# Patient Record
Sex: Male | Born: 1950
Health system: Southern US, Community
[De-identification: ages and names within clinical notes are randomized; demographics above are authoritative.]

## PROBLEM LIST (undated history)

## (undated) DIAGNOSIS — J40 Bronchitis, not specified as acute or chronic: Secondary | ICD-10-CM

## (undated) DIAGNOSIS — C61 Malignant neoplasm of prostate: Secondary | ICD-10-CM

## (undated) DIAGNOSIS — J329 Chronic sinusitis, unspecified: Secondary | ICD-10-CM

## (undated) HISTORY — DX: Chronic sinusitis, unspecified: J32.9

## (undated) HISTORY — DX: Malignant neoplasm of prostate: C61

## (undated) HISTORY — DX: Bronchitis, not specified as acute or chronic: J40

---

## 2004-07-02 ENCOUNTER — Ambulatory Visit: Payer: Self-pay | Admitting: Anesthesiology

## 2004-08-03 ENCOUNTER — Ambulatory Visit: Payer: Self-pay | Admitting: Anesthesiology

## 2004-12-17 ENCOUNTER — Ambulatory Visit: Payer: Self-pay | Admitting: Anesthesiology

## 2005-01-28 ENCOUNTER — Ambulatory Visit: Payer: Self-pay | Admitting: Anesthesiology

## 2005-02-22 ENCOUNTER — Ambulatory Visit: Payer: Self-pay | Admitting: Anesthesiology

## 2005-03-24 ENCOUNTER — Ambulatory Visit: Payer: Self-pay | Admitting: Anesthesiology

## 2005-04-22 ENCOUNTER — Ambulatory Visit: Payer: Self-pay | Admitting: Anesthesiology

## 2005-10-28 ENCOUNTER — Ambulatory Visit: Payer: Self-pay | Admitting: Anesthesiology

## 2005-12-01 ENCOUNTER — Ambulatory Visit: Payer: Self-pay | Admitting: Anesthesiology

## 2006-01-05 ENCOUNTER — Ambulatory Visit: Payer: Self-pay | Admitting: Anesthesiology

## 2006-02-03 ENCOUNTER — Ambulatory Visit: Payer: Self-pay | Admitting: Anesthesiology

## 2006-06-30 ENCOUNTER — Ambulatory Visit: Payer: Self-pay | Admitting: Anesthesiology

## 2006-08-04 ENCOUNTER — Ambulatory Visit: Payer: Self-pay | Admitting: Pain Medicine

## 2006-09-22 ENCOUNTER — Ambulatory Visit: Payer: Self-pay | Admitting: Anesthesiology

## 2006-10-26 ENCOUNTER — Ambulatory Visit: Payer: Self-pay | Admitting: Anesthesiology

## 2007-03-29 ENCOUNTER — Ambulatory Visit: Payer: Self-pay | Admitting: Anesthesiology

## 2007-05-24 ENCOUNTER — Ambulatory Visit: Payer: Self-pay | Admitting: Anesthesiology

## 2007-06-23 ENCOUNTER — Ambulatory Visit: Payer: Self-pay | Admitting: Anesthesiology

## 2007-07-19 ENCOUNTER — Ambulatory Visit: Payer: Self-pay | Admitting: Anesthesiology

## 2008-01-25 ENCOUNTER — Ambulatory Visit: Payer: Self-pay | Admitting: Anesthesiology

## 2008-02-15 ENCOUNTER — Ambulatory Visit: Payer: Self-pay | Admitting: Anesthesiology

## 2008-07-25 ENCOUNTER — Ambulatory Visit: Payer: Self-pay | Admitting: Anesthesiology

## 2008-08-26 ENCOUNTER — Ambulatory Visit: Payer: Self-pay | Admitting: Anesthesiology

## 2009-06-13 ENCOUNTER — Encounter
Admission: RE | Admit: 2009-06-13 | Discharge: 2009-09-02 | Payer: Self-pay | Admitting: Physical Medicine & Rehabilitation

## 2009-06-17 ENCOUNTER — Ambulatory Visit: Payer: Self-pay | Admitting: Physical Medicine & Rehabilitation

## 2009-06-28 ENCOUNTER — Ambulatory Visit (HOSPITAL_COMMUNITY)
Admission: RE | Admit: 2009-06-28 | Discharge: 2009-06-28 | Payer: Self-pay | Admitting: Physical Medicine & Rehabilitation

## 2009-07-07 ENCOUNTER — Ambulatory Visit: Payer: Self-pay | Admitting: Physical Medicine & Rehabilitation

## 2009-08-04 ENCOUNTER — Ambulatory Visit: Payer: Self-pay | Admitting: Physical Medicine & Rehabilitation

## 2009-09-02 ENCOUNTER — Ambulatory Visit: Payer: Self-pay | Admitting: Physical Medicine & Rehabilitation

## 2009-09-06 HISTORY — PX: HIP SURGERY: SHX245

## 2009-09-26 ENCOUNTER — Encounter
Admission: RE | Admit: 2009-09-26 | Discharge: 2009-12-25 | Payer: Self-pay | Admitting: Physical Medicine & Rehabilitation

## 2009-09-29 ENCOUNTER — Ambulatory Visit: Payer: Self-pay | Admitting: Physical Medicine & Rehabilitation

## 2009-10-27 ENCOUNTER — Ambulatory Visit: Payer: Self-pay | Admitting: Physical Medicine & Rehabilitation

## 2009-11-27 ENCOUNTER — Ambulatory Visit: Payer: Self-pay | Admitting: Physical Medicine & Rehabilitation

## 2010-01-01 ENCOUNTER — Encounter
Admission: RE | Admit: 2010-01-01 | Discharge: 2010-01-05 | Payer: Self-pay | Admitting: Physical Medicine & Rehabilitation

## 2010-01-05 ENCOUNTER — Ambulatory Visit: Payer: Self-pay | Admitting: Physical Medicine & Rehabilitation

## 2012-09-06 DIAGNOSIS — C61 Malignant neoplasm of prostate: Secondary | ICD-10-CM

## 2012-09-06 HISTORY — DX: Malignant neoplasm of prostate: C61

## 2015-02-20 ENCOUNTER — Other Ambulatory Visit: Payer: Self-pay | Admitting: Pain Medicine

## 2015-02-20 ENCOUNTER — Ambulatory Visit: Payer: BC Managed Care – PPO | Attending: Pain Medicine | Admitting: Pain Medicine

## 2015-02-20 ENCOUNTER — Encounter: Payer: Self-pay | Admitting: Pain Medicine

## 2015-02-20 VITALS — BP 134/76 | HR 104 | Temp 98.1°F | Resp 16 | Ht 72.0 in | Wt 275.0 lb

## 2015-02-20 DIAGNOSIS — M5030 Other cervical disc degeneration, unspecified cervical region: Secondary | ICD-10-CM

## 2015-02-20 DIAGNOSIS — M542 Cervicalgia: Secondary | ICD-10-CM | POA: Diagnosis present

## 2015-02-20 DIAGNOSIS — C61 Malignant neoplasm of prostate: Secondary | ICD-10-CM | POA: Insufficient documentation

## 2015-02-20 DIAGNOSIS — M4806 Spinal stenosis, lumbar region: Secondary | ICD-10-CM | POA: Diagnosis not present

## 2015-02-20 DIAGNOSIS — M19011 Primary osteoarthritis, right shoulder: Secondary | ICD-10-CM | POA: Diagnosis not present

## 2015-02-20 DIAGNOSIS — M5416 Radiculopathy, lumbar region: Secondary | ICD-10-CM

## 2015-02-20 DIAGNOSIS — M48062 Spinal stenosis, lumbar region with neurogenic claudication: Secondary | ICD-10-CM | POA: Insufficient documentation

## 2015-02-20 DIAGNOSIS — M5136 Other intervertebral disc degeneration, lumbar region: Secondary | ICD-10-CM | POA: Insufficient documentation

## 2015-02-20 DIAGNOSIS — M533 Sacrococcygeal disorders, not elsewhere classified: Secondary | ICD-10-CM | POA: Diagnosis not present

## 2015-02-20 DIAGNOSIS — M47896 Other spondylosis, lumbar region: Secondary | ICD-10-CM | POA: Insufficient documentation

## 2015-02-20 DIAGNOSIS — F410 Panic disorder [episodic paroxysmal anxiety] without agoraphobia: Secondary | ICD-10-CM | POA: Insufficient documentation

## 2015-02-20 DIAGNOSIS — M47812 Spondylosis without myelopathy or radiculopathy, cervical region: Secondary | ICD-10-CM | POA: Insufficient documentation

## 2015-02-20 DIAGNOSIS — M545 Low back pain: Secondary | ICD-10-CM | POA: Diagnosis present

## 2015-02-20 DIAGNOSIS — M792 Neuralgia and neuritis, unspecified: Secondary | ICD-10-CM | POA: Diagnosis not present

## 2015-02-20 DIAGNOSIS — M47816 Spondylosis without myelopathy or radiculopathy, lumbar region: Secondary | ICD-10-CM | POA: Insufficient documentation

## 2015-02-20 DIAGNOSIS — M48061 Spinal stenosis, lumbar region without neurogenic claudication: Secondary | ICD-10-CM | POA: Insufficient documentation

## 2015-02-20 DIAGNOSIS — M461 Sacroiliitis, not elsewhere classified: Secondary | ICD-10-CM | POA: Diagnosis not present

## 2015-02-20 DIAGNOSIS — M169 Osteoarthritis of hip, unspecified: Secondary | ICD-10-CM | POA: Diagnosis not present

## 2015-02-20 MED ORDER — CIPROFLOXACIN HCL 250 MG PO TABS: 250.0000 mg | ORAL_TABLET | Freq: Two times a day (BID) | ORAL | Status: DC

## 2015-02-20 NOTE — Patient Instructions (Addendum)
Continue present medications.  Lumbar facet, medial branch nerve, to be performed Wednesday, 03/12/2015  F/U PCP for evaliation of  BP and general medical  condition. See your primary care physician regarding your elevated pulse and general medical condition   F/U surgical evaluation.  F/U neurological evaluation.  May consider radiofrequency rhizolysis or intraspinal procedures pending response to present treatment and F/U evaluation.  Patient to call [facility] should patient have concerns prior to scheduled return appointment. GENERAL RISKS AND COMPLICATIONS  What are the risk, side effects and possible complications? Generally speaking, most procedures are safe.  However, with any procedure there are risks, side effects, and the possibility of complications.  The risks and complications are dependent upon the sites that are lesioned, or the type of nerve block to be performed.  The closer the procedure is to the spine, the more serious the risks are.  Great care is taken when placing the radio frequency needles, block needles or lesioning probes, but sometimes complications can occur. 1. Infection: Any time there is an injection through the skin, there is a risk of infection.  This is why sterile conditions are used for these blocks.  There are four possible types of infection. 1. Localized skin infection. 2. Central Nervous System Infection-This can be in the form of Meningitis, which can be deadly. 3. Epidural Infections-This can be in the form of an epidural abscess, which can cause pressure inside of the spine, causing compression of the spinal cord with subsequent paralysis. This would require an emergency surgery to decompress, and there are no guarantees that the patient would recover from the paralysis. 4. Discitis-This is an infection of the intervertebral discs.  It occurs in about 1% of discography procedures.  It is difficult to treat and it may lead to surgery.         2. Pain: the needles have to go through skin and soft tissues, will cause soreness.       3. Damage to internal structures:  The nerves to be lesioned may be near blood vessels or    other nerves which can be potentially damaged.       4. Bleeding: Bleeding is more common if the patient is taking blood thinners such as  aspirin, Coumadin, Ticiid, Plavix, etc., or if he/she have some genetic predisposition  such as hemophilia. Bleeding into the spinal canal can cause compression of the spinal  cord with subsequent paralysis.  This would require an emergency surgery to  decompress and there are no guarantees that the patient would recover from the  paralysis.       5. Pneumothorax:  Puncturing of a lung is a possibility, every time a needle is introduced in  the area of the chest or upper back.  Pneumothorax refers to free air around the  collapsed lung(s), inside of the thoracic cavity (chest cavity).  Another two possible  complications related to a similar event would include: Hemothorax and Chylothorax.   These are variations of the Pneumothorax, where instead of air around the collapsed  lung(s), you may have blood or chyle, respectively.       6. Spinal headaches: They may occur with any procedures in the area of the spine.       7. Persistent CSF (Cerebro-Spinal Fluid) leakage: This is a rare problem, but may occur  with prolonged intrathecal or epidural catheters either due to the formation of a fistulous  track or a dural tear.  8. Nerve damage: By working so close to the spinal cord, there is always a possibility of  nerve damage, which could be as serious as a permanent spinal cord injury with  paralysis.       9. Death:  Although rare, severe deadly allergic reactions known as "Anaphylactic  reaction" can occur to any of the medications used.      10. Worsening of the symptoms:  We can always make thing worse.  What are the chances of something like this happening? Chances of any of this  occuring are extremely low.  By statistics, you have more of a chance of getting killed in a motor vehicle accident: while driving to the hospital than any of the above occurring .  Nevertheless, you should be aware that they are possibilities.  In general, it is similar to taking a shower.  Everybody knows that you can slip, hit your head and get killed.  Does that mean that you should not shower again?  Nevertheless always keep in mind that statistics do not mean anything if you happen to be on the wrong side of them.  Even if a procedure has a 1 (one) in a 1,000,000 (million) chance of going wrong, it you happen to be that one..Also, keep in mind that by statistics, you have more of a chance of having something go wrong when taking medications.  Who should not have this procedure? If you are on a blood thinning medication (e.g. Coumadin, Plavix, see list of "Blood Thinners"), or if you have an active infection going on, you should not have the procedure.  If you are taking any blood thinners, please inform your physician.  How should I prepare for this procedure?  Do not eat or drink anything at least six hours prior to the procedure.  Bring a driver with you .  It cannot be a taxi.  Come accompanied by an adult that can drive you back, and that is strong enough to help you if your legs get weak or numb from the local anesthetic.  Take all of your medicines the morning of the procedure with just enough water to swallow them.  If you have diabetes, make sure that you are scheduled to have your procedure done first thing in the morning, whenever possible.  If you have diabetes, take only half of your insulin dose and notify our nurse that you have done so as soon as you arrive at the clinic.  If you are diabetic, but only take blood sugar pills (oral hypoglycemic), then do not take them on the morning of your procedure.  You may take them after you have had the procedure.  Do not take aspirin  or any aspirin-containing medications, at least eleven (11) days prior to the procedure.  They may prolong bleeding.  Wear loose fitting clothing that may be easy to take off and that you would not mind if it got stained with Betadine or blood.  Do not wear any jewelry or perfume  Remove any nail coloring.  It will interfere with some of our monitoring equipment.  NOTE: Remember that this is not meant to be interpreted as a complete list of all possible complications.  Unforeseen problems may occur.  BLOOD THINNERS The following drugs contain aspirin or other products, which can cause increased bleeding during surgery and should not be taken for 2 weeks prior to and 1 week after surgery.  If you should need take something for relief of minor  pain, you may take acetaminophen which is found in Tylenol,m Datril, Anacin-3 and Panadol. It is not blood thinner. The products listed below are.  Do not take any of the products listed below in addition to any listed on your instruction sheet.  A.P.C or A.P.C with Codeine Codeine Phosphate Capsules #3 Ibuprofen Ridaura  ABC compound Congesprin Imuran rimadil  Advil Cope Indocin Robaxisal  Alka-Seltzer Effervescent Pain Reliever and Antacid Coricidin or Coricidin-D  Indomethacin Rufen  Alka-Seltzer plus Cold Medicine Cosprin Ketoprofen S-A-C Tablets  Anacin Analgesic Tablets or Capsules Coumadin Korlgesic Salflex  Anacin Extra Strength Analgesic tablets or capsules CP-2 Tablets Lanoril Salicylate  Anaprox Cuprimine Capsules Levenox Salocol  Anexsia-D Dalteparin Magan Salsalate  Anodynos Darvon compound Magnesium Salicylate Sine-off  Ansaid Dasin Capsules Magsal Sodium Salicylate  Anturane Depen Capsules Marnal Soma  APF Arthritis pain formula Dewitt's Pills Measurin Stanback  Argesic Dia-Gesic Meclofenamic Sulfinpyrazone  Arthritis Bayer Timed Release Aspirin Diclofenac Meclomen Sulindac  Arthritis pain formula Anacin Dicumarol Medipren Supac   Analgesic (Safety coated) Arthralgen Diffunasal Mefanamic Suprofen  Arthritis Strength Bufferin Dihydrocodeine Mepro Compound Suprol  Arthropan liquid Dopirydamole Methcarbomol with Aspirin Synalgos  ASA tablets/Enseals Disalcid Micrainin Tagament  Ascriptin Doan's Midol Talwin  Ascriptin A/D Dolene Mobidin Tanderil  Ascriptin Extra Strength Dolobid Moblgesic Ticlid  Ascriptin with Codeine Doloprin or Doloprin with Codeine Momentum Tolectin  Asperbuf Duoprin Mono-gesic Trendar  Aspergum Duradyne Motrin or Motrin IB Triminicin  Aspirin plain, buffered or enteric coated Durasal Myochrisine Trigesic  Aspirin Suppositories Easprin Nalfon Trillsate  Aspirin with Codeine Ecotrin Regular or Extra Strength Naprosyn Uracel  Atromid-S Efficin Naproxen Ursinus  Auranofin Capsules Elmiron Neocylate Vanquish  Axotal Emagrin Norgesic Verin  Azathioprine Empirin or Empirin with Codeine Normiflo Vitamin E  Azolid Emprazil Nuprin Voltaren  Bayer Aspirin plain, buffered or children's or timed BC Tablets or powders Encaprin Orgaran Warfarin Sodium  Buff-a-Comp Enoxaparin Orudis Zorpin  Buff-a-Comp with Codeine Equegesic Os-Cal-Gesic   Buffaprin Excedrin plain, buffered or Extra Strength Oxalid   Bufferin Arthritis Strength Feldene Oxphenbutazone   Bufferin plain or Extra Strength Feldene Capsules Oxycodone with Aspirin   Bufferin with Codeine Fenoprofen Fenoprofen Pabalate or Pabalate-SF   Buffets II Flogesic Panagesic   Buffinol plain or Extra Strength Florinal or Florinal with Codeine Panwarfarin   Buf-Tabs Flurbiprofen Penicillamine   Butalbital Compound Four-way cold tablets Penicillin   Butazolidin Fragmin Pepto-Bismol   Carbenicillin Geminisyn Percodan   Carna Arthritis Reliever Geopen Persantine   Carprofen Gold's salt Persistin   Chloramphenicol Goody's Phenylbutazone   Chloromycetin Haltrain Piroxlcam   Clmetidine heparin Plaquenil   Cllnoril Hyco-pap Ponstel   Clofibrate Hydroxy  chloroquine Propoxyphen         Before stopping any of these medications, be sure to consult the physician who ordered them.  Some, such as Coumadin (Warfarin) are ordered to prevent or treat serious conditions such as "deep thrombosis", "pumonary embolisms", and other heart problems.  The amount of time that you may need off of the medication may also vary with the medication and the reason for which you were taking it.  If you are taking any of these medications, please make sure you notify your pain physician before you undergo any procedures.         Facet Joint Block The facet joints connect the bones of the spine (vertebrae). They make it possible for you to bend, twist, and make other movements with your spine. They also prevent you from overbending, overtwisting, and making other excessive movements.  A facet  joint block is a procedure where a numbing medicine (anesthetic) is injected into a facet joint. Often, a type of anti-inflammatory medicine called a steroid is also injected. A facet joint block may be done for two reasons:  2. Diagnosis. A facet joint block may be done as a test to see whether neck or back pain is caused by a worn-down or infected facet joint. If the pain gets better after a facet joint block, it means the pain is probably coming from the facet joint. If the pain does not get better, it means the pain is probably not coming from the facet joint.  3. Therapy. A facet joint block may be done to relieve neck or back pain caused by a facet joint. A facet joint block is only done as a therapy if the pain does not improve with medicine, exercise programs, physical therapy, and other forms of pain management. LET [facility] CARE PROVIDER KNOW ABOUT:   Any allergies you have.   All medicines you are taking, including vitamins, herbs, eyedrops, and over-the-counter medicines and creams.   Previous problems you or members of your family have had with the use of  anesthetics.   Any blood disorders you have had.   Other health problems you have. RISKS AND COMPLICATIONS Generally, having a facet joint block is safe. However, as with any procedure, complications can occur. Possible complications associated with having a facet joint block include:   Bleeding.   Injury to a nerve near the injection site.   Pain at the injection site.   Weakness or numbness in areas controlled by nerves near the injection site.   Infection.   Temporary fluid retention.   Allergic reaction to anesthetics or medicines used during the procedure. BEFORE THE PROCEDURE   Follow your health care provider's instructions if you are taking dietary supplements or medicines. You may need to stop taking them or reduce your dosage.   Do not take any new dietary supplements or medicines without asking your health care provider first.   Follow your health care provider's instructions about eating and drinking before the procedure. You may need to stop eating and drinking several hours before the procedure.   Arrange to have an adult drive you home after the procedure. PROCEDURE 12. You may need to remove your clothing and dress in an open-back gown so that your health care provider can access your spine.  13. The procedure will be done while you are lying on an X-ray table. Most of the time you will be asked to lie on your stomach, but you may be asked to lie in a different position if an injection will be made in your neck.  14. Special machines will be used to monitor your oxygen levels, heart rate, and blood pressure.  15. If an injection will be made in your neck, an intravenous (IV) tube will be inserted into one of your veins. Fluids and medicine will flow directly into your body through the IV tube.  16. The area over the facet joint where the injection will be made will be cleaned with an antiseptic soap. The surrounding skin will be covered with sterile  drapes.  17. An anesthetic will be applied to your skin to make the injection area numb. You may feel a temporary stinging or burning sensation.  18. A video X-ray machine will be used to locate the joint. A contrast dye may be injected into the facet joint area to help with locating  the joint.  19. When the joint is located, an anesthetic medicine will be injected into the joint through the needle.  60. Your health care provider will ask you whether you feel pain relief. If you do feel relief, a steroid may be injected to provide pain relief for a longer period of time. If you do not feel relief or feel only partial relief, additional injections of an anesthetic may be made in other facet joints.  21. The needle will be removed, the skin will be cleansed, and bandages will be applied.  AFTER THE PROCEDURE   You will be observed for 15-30 minutes before being allowed to go home. Do not drive. Have an adult drive you or take a taxi or public transportation instead.   If you feel pain relief, the pain will return in several hours or days when the anesthetic wears off.   You may feel pain relief 2-14 days after the procedure. The amount of time this relief lasts varies from person to person.   It is normal to feel some tenderness over the injected area(s) for 2 days following the procedure.   If you have diabetes, you may have a temporary increase in blood sugar. Document Released: 01/12/2007 Document Revised: 01/07/2014 Document Reviewed: 06/12/2012 [facility] Patient Information 2015 [place], [place]. This information is not intended to replace advice given to you by your health care provider. Make sure you discuss any questions you have with your health care provider.

## 2015-02-20 NOTE — Progress Notes (Signed)
Subjective:    Patient ID: Martin Golden, male    DOB: 1951-01-03, 64 y.o.   MRN: [id number]  HPI  Patient is 64 year old gentleman who comes to pain management Center at the request of Dr. Lisette Grinder the third for further evaluation and treatment of pain involving the lumbar lower extremity region as well as region of the neck. Patient is with prior interventional treatment performed prior to coming to [facility] Center with persistent pain despite prior treatment. Patient states that his pain involving the lower back and lower extremity region has been present for approximately 15 years. Patient states that pain involving the neck began approximately 1 year ago patient was treated at the [facility] in [facility]. Patient stated that the lower back lower extremity pain was most bothersome at this time describing the pain as aching exhausting tiring sensation awakening patient from sleep and associated with fatigue. Patient stated the pain increases with bending standing squatting stooping and twisting. Patient stated the pain had decreased to some degree with resting hot packs lying down physical therapy and medications. We discussed patient's condition on today's visit and reviewed patient's prior lumbar MRI. The results of the cervical MRI studies were unavailable on today's visit. Evaluation of patient and discussion of patient's condition and decision was made to proceed with lumbar facet, medial branch nerve, blocks at time return appointment in attempt to decrease severity of patient's symptoms. Minimize progression of patient's symptoms. And hopefully avoid the need for more involved treatment. The patient was understanding and agreement with suggested treatment plan.      Review of Systems  Cardiovascular Needs antibiotic prior to procedures due to prior total hip  replacement  Pulmonary Unremarkable  Neurological Unremarkable  Psychological Panic attacks  Gastrointestinal Unremarkable  Genitourinary Blood in urine Cancer of prostate  Hematological Unremarkable  Endocrine Unremarkable  Rheumatological Osteoarthritis  Musculoskeletal Unremarkable  Other significant Cancer prostate               Objective:   Physical Exam  There was tenderness to palpation of the splenius capitis of talus region of moderate degree. There was tenderness to palpation of the trapezius levator scapula and rhomboid musculature regions of moderate degree. Palpation over the thoracic facet thoracic paraspinal musculature region was with moderate tends to palpation. Palpation of the acromioclavicular glenohumeral joint region was with mild discomfort. Patient appeared to be with slightly decreased grip strength. Tinel and Phalen's maneuver were without increase of pain of significant degree. Palpation over the region of the thoracic facet thoracic paraspinal musculature region was with moderate tends to palpation of the mid and lower thoracic paraspinal musculature region. Palpation over the lumbar paraspinal musculature region lumbar facet region associated with moderate to moderately severe discomfort. Lateral bending and rotation reproduce moderate to moderate severe discomfort. Straight leg raising was tolerates approximately a definite increased pain with dorsiflexion noted. There was negative clonus negative Homans. DTRs difficult to elicit patient had difficulty relaxing. There was tenderness over the PSIS and PII S region a moderate degree. Abdomen nontender no costovertebral angle tenderness noted.    Assessment & Plan:  Degenerative disc disease lumbar spine Lumbar spondylosis with degenerative disc disease causing multilevel central foraminal and subarticular lateral recess stenosis  Lumbar facet syndrome  Lumbar radiculopathy  Lumbar  stenosis  Sacroiliac joint dysfunction  History of right renal cyst with suspected left renal atrophy  Carcinoma of prostate  Degenerative disc disease cervical spine    Plan  Continue present medications.  Lumbar facet, medial branch nerve, blocks to be performed at time return appointment  F/U PCP for evaliation of  BP and general medical  condition.  F/U surgical evaluation.  F/U neurological evaluation.  CT of cervical spine. We will obtain CT of cervical spine for update of the cervical spine. Patient is with prior total hip replacement and is unable to undergo  MRI.  May consider radiofrequency rhizolysis or intraspinal procedures pending response to present treatment and F/U evaluation.  Patient to call [facility] Management Center should patient have concerns prior to scheduled return appointment.   Cervical facet syndrome

## 2015-02-20 NOTE — Progress Notes (Signed)
Discharged to home ambulatory.  Pre procedure instructions given with teach back 3 done.  Denies blood thinners.

## 2015-02-20 NOTE — Progress Notes (Signed)
Safety precautions to be maintained throughout the outpatient stay will include: orient to surroundings, keep bed in low position, maintain call bell within reach at all times, provide assistance with transfer out of bed and ambulation.  

## 2015-03-03 ENCOUNTER — Telehealth: Payer: Self-pay

## 2015-03-03 ENCOUNTER — Telehealth: Payer: Self-pay | Admitting: *Deleted

## 2015-03-03 NOTE — Telephone Encounter (Signed)
Nurses and Blanch Media   Please document this in the patient's medications

## 2015-03-03 NOTE — Telephone Encounter (Signed)
Added phentermine 37.5 mg taken once per day  medication to med list.

## 2015-03-03 NOTE — Telephone Encounter (Signed)
Patient wanted to let you know that he had been taking a medication called phenpermine 37.5 mg once a day. He was taking when he had his uds but is not longer taking it. It is for weight loss. It may show up in his drug screen.

## 2015-03-03 NOTE — Telephone Encounter (Signed)
Im not sure if I have the authorization to do this. Nurses please add this medication to the patients charts. Thanks

## 2015-03-04 ENCOUNTER — Other Ambulatory Visit: Payer: Self-pay | Admitting: Pain Medicine

## 2015-03-11 DIAGNOSIS — Z6838 Body mass index (BMI) 38.0-38.9, adult: Secondary | ICD-10-CM | POA: Diagnosis not present

## 2015-03-11 DIAGNOSIS — R635 Abnormal weight gain: Secondary | ICD-10-CM | POA: Diagnosis not present

## 2015-03-11 DIAGNOSIS — E785 Hyperlipidemia, unspecified: Secondary | ICD-10-CM | POA: Diagnosis not present

## 2015-03-11 DIAGNOSIS — I10 Essential (primary) hypertension: Secondary | ICD-10-CM | POA: Diagnosis not present

## 2015-03-12 ENCOUNTER — Encounter: Payer: Self-pay | Admitting: Pain Medicine

## 2015-03-12 ENCOUNTER — Ambulatory Visit: Payer: Medicare Other | Attending: Pain Medicine | Admitting: Pain Medicine

## 2015-03-12 VITALS — BP 137/89 | HR 83 | Temp 97.9°F | Resp 16 | Ht 73.0 in | Wt 250.0 lb

## 2015-03-12 DIAGNOSIS — M533 Sacrococcygeal disorders, not elsewhere classified: Secondary | ICD-10-CM

## 2015-03-12 DIAGNOSIS — M5030 Other cervical disc degeneration, unspecified cervical region: Secondary | ICD-10-CM

## 2015-03-12 DIAGNOSIS — M47817 Spondylosis without myelopathy or radiculopathy, lumbosacral region: Secondary | ICD-10-CM | POA: Diagnosis not present

## 2015-03-12 DIAGNOSIS — M79604 Pain in right leg: Secondary | ICD-10-CM | POA: Diagnosis present

## 2015-03-12 DIAGNOSIS — M79605 Pain in left leg: Secondary | ICD-10-CM | POA: Diagnosis present

## 2015-03-12 DIAGNOSIS — M4806 Spinal stenosis, lumbar region: Secondary | ICD-10-CM | POA: Insufficient documentation

## 2015-03-12 DIAGNOSIS — M47816 Spondylosis without myelopathy or radiculopathy, lumbar region: Secondary | ICD-10-CM

## 2015-03-12 DIAGNOSIS — M47812 Spondylosis without myelopathy or radiculopathy, cervical region: Secondary | ICD-10-CM

## 2015-03-12 DIAGNOSIS — M545 Low back pain: Secondary | ICD-10-CM | POA: Diagnosis present

## 2015-03-12 DIAGNOSIS — M5416 Radiculopathy, lumbar region: Secondary | ICD-10-CM

## 2015-03-12 DIAGNOSIS — M48061 Spinal stenosis, lumbar region without neurogenic claudication: Secondary | ICD-10-CM

## 2015-03-12 DIAGNOSIS — M51369 Other intervertebral disc degeneration, lumbar region without mention of lumbar back pain or lower extremity pain: Secondary | ICD-10-CM

## 2015-03-12 DIAGNOSIS — M5136 Other intervertebral disc degeneration, lumbar region: Secondary | ICD-10-CM | POA: Insufficient documentation

## 2015-03-12 MED ORDER — TRIAMCINOLONE ACETONIDE 40 MG/ML IJ SUSP
INTRAMUSCULAR | Status: AC
Start: 2015-03-12 — End: 2015-03-12
  Administered 2015-03-12: 40 mg
  Filled 2015-03-12: qty 1

## 2015-03-12 MED ORDER — CIPROFLOXACIN IN D5W 400 MG/200ML IV SOLN
INTRAVENOUS | Status: AC
  Filled 2015-03-12: qty 200

## 2015-03-12 MED ORDER — CEFUROXIME AXETIL 250 MG PO TABS: 250.0000 mg | ORAL_TABLET | Freq: Two times a day (BID) | ORAL | Status: DC

## 2015-03-12 MED ORDER — BUPIVACAINE HCL (PF) 0.25 % IJ SOLN
INTRAMUSCULAR | Status: AC
Start: 2015-03-12 — End: 2015-03-12
  Administered 2015-03-12: 20 mL
  Filled 2015-03-12: qty 30

## 2015-03-12 MED ORDER — CEFAZOLIN SODIUM 1 G IJ SOLR
INTRAMUSCULAR | Status: AC
  Administered 2015-03-12: 1 g via INTRAVENOUS
  Filled 2015-03-12: qty 10

## 2015-03-12 MED ORDER — ORPHENADRINE CITRATE 30 MG/ML IJ SOLN
INTRAMUSCULAR | Status: DC
Start: 2015-03-12 — End: 2015-03-12
  Filled 2015-03-12: qty 2

## 2015-03-12 MED ORDER — FENTANYL CITRATE (PF) 100 MCG/2ML IJ SOLN
INTRAMUSCULAR | Status: AC
  Administered 2015-03-12: 100 ug via INTRAVENOUS
  Filled 2015-03-12: qty 2

## 2015-03-12 MED ORDER — MIDAZOLAM HCL 5 MG/5ML IJ SOLN
INTRAMUSCULAR | Status: AC
  Administered 2015-03-12: 5 mg via INTRAVENOUS
  Filled 2015-03-12: qty 5

## 2015-03-12 NOTE — Patient Instructions (Addendum)
Continue present medications. Please take Ceftin antibiotic as prescribed   F/U PCP for evaliation of  BP and general medical  condition.  F/U surgical evaluation  F/U neurological evaluation  May consider radiofrequency rhizolysis or intraspinal procedures pending response to present treatment and F/U evaluation.  Patient to call [facility] should patient have concerns prior to scheduled return appointment. Pain Management Discharge Instructions  General Discharge Instructions :  If you need to reach your doctor call: Monday-Friday 8:00 am - 4:00 pm at [phone] or toll free [phone].  After clinic hours [phone] to have operator reach doctor.  Bring all of your medication bottles to all your appointments in the pain clinic.  To cancel or reschedule your appointment with Pain Management please remember to call 24 hours in advance to avoid a fee.  Refer to the educational materials which you have been given on: General Risks, I had my Procedure. Discharge Instructions, Post Sedation.  Post Procedure Instructions:  The drugs you were given will stay in your system until tomorrow, so for the next 24 hours you should not drive, make any legal decisions or drink any alcoholic beverages.  You may eat anything you prefer, but it is better to start with liquids then soups and crackers, and gradually work up to solid foods.  Please notify your doctor immediately if you have any unusual bleeding, trouble breathing or pain that is not related to your normal pain.  Depending on the type of procedure that was done, some parts of your body may feel week and/or numb.  This usually clears up by tonight or the next day.  Walk with the use of an assistive device or accompanied by an adult for the 24 hours.  You may use ice on the affected area for the first 24 hours.  Put ice in a Ziploc bag and cover with a towel and place against area 15 minutes on 15 minutes off.  You may  switch to heat after 24 hours.Facet Joint Block The facet joints connect the bones of the spine (vertebrae). They make it possible for you to bend, twist, and make other movements with your spine. They also prevent you from overbending, overtwisting, and making other excessive movements.  A facet joint block is a procedure where a numbing medicine (anesthetic) is injected into a facet joint. Often, a type of anti-inflammatory medicine called a steroid is also injected. A facet joint block may be done for two reasons:   Diagnosis. A facet joint block may be done as a test to see whether neck or back pain is caused by a worn-down or infected facet joint. If the pain gets better after a facet joint block, it means the pain is probably coming from the facet joint. If the pain does not get better, it means the pain is probably not coming from the facet joint.   Therapy. A facet joint block may be done to relieve neck or back pain caused by a facet joint. A facet joint block is only done as a therapy if the pain does not improve with medicine, exercise programs, physical therapy, and other forms of pain management. LET [facility] CARE PROVIDER KNOW ABOUT:   Any allergies you have.   All medicines you are taking, including vitamins, herbs, eyedrops, and over-the-counter medicines and creams.   Previous problems you or members of your family have had with the use of anesthetics.   Any blood disorders you have had.   Other health  problems you have. RISKS AND COMPLICATIONS Generally, having a facet joint block is safe. However, as with any procedure, complications can occur. Possible complications associated with having a facet joint block include:   Bleeding.   Injury to a nerve near the injection site.   Pain at the injection site.   Weakness or numbness in areas controlled by nerves near the injection site.   Infection.   Temporary fluid retention.   Allergic reaction to  anesthetics or medicines used during the procedure. BEFORE THE PROCEDURE   Follow your health care provider's instructions if you are taking dietary supplements or medicines. You may need to stop taking them or reduce your dosage.   Do not take any new dietary supplements or medicines without asking your health care provider first.   Follow your health care provider's instructions about eating and drinking before the procedure. You may need to stop eating and drinking several hours before the procedure.   Arrange to have an adult drive you home after the procedure. PROCEDURE  You may need to remove your clothing and dress in an open-back gown so that your health care provider can access your spine.   The procedure will be done while you are lying on an X-ray table. Most of the time you will be asked to lie on your stomach, but you may be asked to lie in a different position if an injection will be made in your neck.   Special machines will be used to monitor your oxygen levels, heart rate, and blood pressure.   If an injection will be made in your neck, an intravenous (IV) tube will be inserted into one of your veins. Fluids and medicine will flow directly into your body through the IV tube.   The area over the facet joint where the injection will be made will be cleaned with an antiseptic soap. The surrounding skin will be covered with sterile drapes.   An anesthetic will be applied to your skin to make the injection area numb. You may feel a temporary stinging or burning sensation.   A video X-ray machine will be used to locate the joint. A contrast dye may be injected into the facet joint area to help with locating the joint.   When the joint is located, an anesthetic medicine will be injected into the joint through the needle.   Your health care provider will ask you whether you feel pain relief. If you do feel relief, a steroid may be injected to provide pain relief for a  longer period of time. If you do not feel relief or feel only partial relief, additional injections of an anesthetic may be made in other facet joints.   The needle will be removed, the skin will be cleansed, and bandages will be applied.  AFTER THE PROCEDURE   You will be observed for 15-30 minutes before being allowed to go home. Do not drive. Have an adult drive you or take a taxi or public transportation instead.   If you feel pain relief, the pain will return in several hours or days when the anesthetic wears off.   You may feel pain relief 2-14 days after the procedure. The amount of time this relief lasts varies from person to person.   It is normal to feel some tenderness over the injected area(s) for 2 days following the procedure.   If you have diabetes, you may have a temporary increase in blood sugar. Document Released: 01/12/2007 Document Revised:  01/07/2014 Document Reviewed: 06/12/2012 [employer] Patient Information 2015 [place], [place]. This information is not intended to replace advice given to you by your health care provider. Make sure you discuss any questions you have with your health care provider.

## 2015-03-12 NOTE — Progress Notes (Signed)
Patient instructed that antibiotic was at pharmacy to be picked up.

## 2015-03-12 NOTE — Progress Notes (Signed)
Subjective:    Patient ID: Martin Golden, male    DOB: Jan 15, 1951, 64 y.o.   MRN: [id number]  HPI  PROCEDURE PERFORMED: Lumbar facet (medial branch block)   NOTE: The patient is a 64 y.o. male who returns to [facility] for further evaluation and treatment of pain involving the lumbar and lower extremity region. MRI  revealed the patient to be with evidence of multilevel degenerative changes with lumbar spondylosis and degenerative disc disease causing multilevel central, foraminal, and subarticular lateral recess stenosis . There is concern regarding significant component of patient's pain being due to facet arthropathy reducing facet syndrome. The risks, benefits, and expectations of the procedure have been discussed and explained to the patient who was understanding and in agreement with suggested treatment plan. We will proceed with interventional treatment as discussed and as explained to the patient who was understanding and wished to proceed with procedure as planned.   DESCRIPTION OF PROCEDURE: Lumbar facet (medial branch block) with IV Versed, IV fentanyl conscious sedation, EKG, blood pressure, pulse, and pulse oximetry monitoring. The procedure was performed with the patient in the prone position. Betadine prep of proposed entry site performed.   NEEDLE PLACEMENT AT: Left L 3 lumbar facet (medial branch block). Under fluoroscopic guidance with oblique orientation of 15 degrees, a 22-gauge needle was inserted at the L 3 vertebral body level with needle placed at the targeted area of Burton's Eye or Eye of the Scotty Dog with documentation of needle placement in the superior and lateral border of targeted area of Burton's Eye or Eye of the Scotty Dog with oblique orientation of 15 degrees. Following documentation of needle placement at the L 3 vertebral body level, needle placement was then accomplished at the L 4 vertebral body level.   NEEDLE PLACEMENT AT L4 and L5 VERTEBRAL  BODY LEVELS ON THE LEFT SIDE The procedure was performed at the L4 and L5 vertebral body levels exactly as was performed at the L 3 vertebral body level utilizing the same technique and under fluoroscopic guidance.  NEEDLE PLACEMENT AT THE SACRAL ALA with AP view of the lumbosacral spine. With the patient in the prone position, Betadine prep of proposed entry site accomplished, a 22 gauge needle was inserted in the region of the sacral ala (groove formed by the superior articulating process of S1 and the sacral wing). Following documentation of needle placement at the sacral ala,  needle placement was then accomplished at the S1 foramen level.   NEEDLE PLACEMENT AT THE S1 FORAMEN LEVEL under fluoroscopic guidance with AP view of the lumbosacral spine and cephalad orientation of the fluoroscope, a 22-gauge needle was placed at the superior and lateral border of the S1 foramen under fluoroscopic guidance. Following documentation of needle placement at the S1 foramen.   Needle placement was then verified at all levels on lateral view. Following documentation of needle placement at all levels on lateral view and following negative aspiration for heme and CSF, each level was injected with 1 mL of 0.25% bupivacaine with Kenalog.     LUMBAR FACET, MEDIAL BRANCH NERVE, BLOCKS PERFORMED ON THE RIGHT SIDE   The procedure was performed on the right side exactly as was performed on the left side at the same levels and utilizing the same technique under fluoroscopic guidance.     The patient tolerated the procedure well. A total of 40 mg of Kenalog was utilized for the procedure.   PLAN:  1. Medications: The patient will continue presently  prescribed medications. 2. May consider modification of treatment regimen at time of return appointment pending response to treatment rendered on today's visit. 3. The patient is to follow-up with primary care physician Dr. Lisette Grinder III for further evaluation of  blood pressure and general medical condition status post steroid injection performed on today's visit. 4. Surgical follow-up evaluation. 5. Neurological follow-up evaluation. 6. The patient may be candidate for radiofrequency procedures, implantation type procedures, and other treatment pending response to treatment and follow-up evaluation. 7. The patient has been advised to call the Pain Management Center prior to scheduled return appointment should there be significant change in condition or should patient have other concerns regarding condition prior to scheduled return appointment.  The patient is understanding and in agreement with suggested treatment plan.      Review of Systems     Objective:   Physical Exam        Assessment & Plan:

## 2015-03-13 ENCOUNTER — Telehealth: Payer: Self-pay | Admitting: *Deleted

## 2015-03-14 ENCOUNTER — Ambulatory Visit: Payer: Medicare Other | Attending: Pain Medicine

## 2015-03-14 DIAGNOSIS — F322 Major depressive disorder, single episode, severe without psychotic features: Secondary | ICD-10-CM | POA: Diagnosis not present

## 2015-03-17 NOTE — Telephone Encounter (Signed)
No problems with procedure,patient will talk  To Dr Primus Bravo about his medications

## 2015-03-25 DIAGNOSIS — F322 Major depressive disorder, single episode, severe without psychotic features: Secondary | ICD-10-CM | POA: Diagnosis not present

## 2015-04-01 DIAGNOSIS — I10 Essential (primary) hypertension: Secondary | ICD-10-CM | POA: Diagnosis not present

## 2015-04-04 DIAGNOSIS — F322 Major depressive disorder, single episode, severe without psychotic features: Secondary | ICD-10-CM | POA: Diagnosis not present

## 2015-04-08 DIAGNOSIS — I10 Essential (primary) hypertension: Secondary | ICD-10-CM | POA: Diagnosis not present

## 2015-04-08 DIAGNOSIS — Z23 Encounter for immunization: Secondary | ICD-10-CM | POA: Diagnosis not present

## 2015-04-09 ENCOUNTER — Ambulatory Visit
Admission: RE | Admit: 2015-04-09 | Discharge: 2015-04-09 | Disposition: A | Discharge status: Home / Self Care | Payer: Medicare Other | Source: Ambulatory Visit | Attending: Pain Medicine | Admitting: Pain Medicine

## 2015-04-09 ENCOUNTER — Other Ambulatory Visit: Payer: Self-pay | Admitting: Pain Medicine

## 2015-04-09 DIAGNOSIS — M47812 Spondylosis without myelopathy or radiculopathy, cervical region: Secondary | ICD-10-CM | POA: Diagnosis not present

## 2015-04-09 DIAGNOSIS — M47892 Other spondylosis, cervical region: Secondary | ICD-10-CM | POA: Insufficient documentation

## 2015-04-09 DIAGNOSIS — M47816 Spondylosis without myelopathy or radiculopathy, lumbar region: Secondary | ICD-10-CM

## 2015-04-09 DIAGNOSIS — M5136 Other intervertebral disc degeneration, lumbar region: Secondary | ICD-10-CM

## 2015-04-09 DIAGNOSIS — M5382 Other specified dorsopathies, cervical region: Secondary | ICD-10-CM | POA: Diagnosis present

## 2015-04-09 DIAGNOSIS — M5030 Other cervical disc degeneration, unspecified cervical region: Secondary | ICD-10-CM

## 2015-04-09 DIAGNOSIS — M5416 Radiculopathy, lumbar region: Secondary | ICD-10-CM

## 2015-04-09 DIAGNOSIS — M48061 Spinal stenosis, lumbar region without neurogenic claudication: Secondary | ICD-10-CM

## 2015-04-09 DIAGNOSIS — M533 Sacrococcygeal disorders, not elsewhere classified: Secondary | ICD-10-CM

## 2015-04-09 NOTE — Progress Notes (Signed)
Patient does not take asa or other blood thinners

## 2015-04-10 ENCOUNTER — Encounter: Payer: Self-pay | Admitting: Pain Medicine

## 2015-04-10 ENCOUNTER — Ambulatory Visit: Payer: Medicare Other | Attending: Pain Medicine | Admitting: Pain Medicine

## 2015-04-10 VITALS — BP 135/83 | HR 97 | Temp 97.6°F | Resp 18 | Ht 73.0 in | Wt 267.0 lb

## 2015-04-10 DIAGNOSIS — M533 Sacrococcygeal disorders, not elsewhere classified: Secondary | ICD-10-CM | POA: Diagnosis not present

## 2015-04-10 DIAGNOSIS — M79605 Pain in left leg: Secondary | ICD-10-CM | POA: Diagnosis present

## 2015-04-10 DIAGNOSIS — M5030 Other cervical disc degeneration, unspecified cervical region: Secondary | ICD-10-CM | POA: Diagnosis not present

## 2015-04-10 DIAGNOSIS — M1288 Other specific arthropathies, not elsewhere classified, other specified site: Secondary | ICD-10-CM | POA: Diagnosis not present

## 2015-04-10 DIAGNOSIS — M5136 Other intervertebral disc degeneration, lumbar region: Secondary | ICD-10-CM | POA: Diagnosis not present

## 2015-04-10 DIAGNOSIS — N281 Cyst of kidney, acquired: Secondary | ICD-10-CM | POA: Insufficient documentation

## 2015-04-10 DIAGNOSIS — M545 Low back pain: Secondary | ICD-10-CM | POA: Diagnosis present

## 2015-04-10 DIAGNOSIS — M5416 Radiculopathy, lumbar region: Secondary | ICD-10-CM

## 2015-04-10 DIAGNOSIS — C61 Malignant neoplasm of prostate: Secondary | ICD-10-CM | POA: Diagnosis not present

## 2015-04-10 DIAGNOSIS — M47896 Other spondylosis, lumbar region: Secondary | ICD-10-CM | POA: Diagnosis not present

## 2015-04-10 DIAGNOSIS — M48061 Spinal stenosis, lumbar region without neurogenic claudication: Secondary | ICD-10-CM

## 2015-04-10 DIAGNOSIS — M47812 Spondylosis without myelopathy or radiculopathy, cervical region: Secondary | ICD-10-CM

## 2015-04-10 DIAGNOSIS — M4806 Spinal stenosis, lumbar region: Secondary | ICD-10-CM | POA: Insufficient documentation

## 2015-04-10 DIAGNOSIS — M79604 Pain in right leg: Secondary | ICD-10-CM | POA: Diagnosis present

## 2015-04-10 DIAGNOSIS — M47816 Spondylosis without myelopathy or radiculopathy, lumbar region: Secondary | ICD-10-CM

## 2015-04-10 DIAGNOSIS — M79609 Pain in unspecified limb: Secondary | ICD-10-CM | POA: Diagnosis not present

## 2015-04-10 DIAGNOSIS — M47817 Spondylosis without myelopathy or radiculopathy, lumbosacral region: Secondary | ICD-10-CM | POA: Diagnosis not present

## 2015-04-10 DIAGNOSIS — F322 Major depressive disorder, single episode, severe without psychotic features: Secondary | ICD-10-CM | POA: Diagnosis not present

## 2015-04-10 MED ORDER — GABAPENTIN 300 MG PO CAPS: ORAL_CAPSULE | ORAL | Status: DC

## 2015-04-10 MED ORDER — HYDROCODONE-ACETAMINOPHEN 7.5-300 MG PO TABS: ORAL_TABLET | ORAL | Status: DC

## 2015-04-10 NOTE — Patient Instructions (Addendum)
Continue present medication. Hydrocodone acetaminophen  Lumbar facet, medial branch nerve, blocks to be performed Wednesday, 04/23/2015  F/U PCP Dr. Lisette Grinder the third for evaliation of  BP and general medical  condition  F/U surgical evaluation  F/U neurological evaluation  F/U psych evaluation with Dr. Clovis Pu as planned  May consider radiofrequency rhizolysis or intraspinal procedures pending response to present treatment and F/U evaluation   Patient to call [facility] should patient have concerns prior to scheduled return appointmen. GENERAL RISKS AND COMPLICATIONS  What are the risk, side effects and possible complications? Generally speaking, most procedures are safe.  However, with any procedure there are risks, side effects, and the possibility of complications.  The risks and complications are dependent upon the sites that are lesioned, or the type of nerve block to be performed.  The closer the procedure is to the spine, the more serious the risks are.  Great care is taken when placing the radio frequency needles, block needles or lesioning probes, but sometimes complications can occur. 1. Infection: Any time there is an injection through the skin, there is a risk of infection.  This is why sterile conditions are used for these blocks.  There are four possible types of infection. 1. Localized skin infection. 2. Central Nervous System Infection-This can be in the form of Meningitis, which can be deadly. 3. Epidural Infections-This can be in the form of an epidural abscess, which can cause pressure inside of the spine, causing compression of the spinal cord with subsequent paralysis. This would require an emergency surgery to decompress, and there are no guarantees that the patient would recover from the paralysis. 4. Discitis-This is an infection of the intervertebral discs.  It occurs in about 1% of discography procedures.  It is difficult to treat and it may lead to  surgery.        2. Pain: the needles have to go through skin and soft tissues, will cause soreness.       3. Damage to internal structures:  The nerves to be lesioned may be near blood vessels or    other nerves which can be potentially damaged.       4. Bleeding: Bleeding is more common if the patient is taking blood thinners such as  aspirin, Coumadin, Ticiid, Plavix, etc., or if he/she have some genetic predisposition  such as hemophilia. Bleeding into the spinal canal can cause compression of the spinal  cord with subsequent paralysis.  This would require an emergency surgery to  decompress and there are no guarantees that the patient would recover from the  paralysis.       5. Pneumothorax:  Puncturing of a lung is a possibility, every time a needle is introduced in  the area of the chest or upper back.  Pneumothorax refers to free air around the  collapsed lung(s), inside of the thoracic cavity (chest cavity).  Another two possible  complications related to a similar event would include: Hemothorax and Chylothorax.   These are variations of the Pneumothorax, where instead of air around the collapsed  lung(s), you may have blood or chyle, respectively.       6. Spinal headaches: They may occur with any procedures in the area of the spine.       7. Persistent CSF (Cerebro-Spinal Fluid) leakage: This is a rare problem, but may occur  with prolonged intrathecal or epidural catheters either due to the formation of a fistulous  track or a dural tear.  8. Nerve damage: By working so close to the spinal cord, there is always a possibility of  nerve damage, which could be as serious as a permanent spinal cord injury with  paralysis.       9. Death:  Although rare, severe deadly allergic reactions known as "Anaphylactic  reaction" can occur to any of the medications used.      10. Worsening of the symptoms:  We can always make thing worse.  What are the chances of something like this  happening? Chances of any of this occuring are extremely low.  By statistics, you have more of a chance of getting killed in a motor vehicle accident: while driving to the hospital than any of the above occurring .  Nevertheless, you should be aware that they are possibilities.  In general, it is similar to taking a shower.  Everybody knows that you can slip, hit your head and get killed.  Does that mean that you should not shower again?  Nevertheless always keep in mind that statistics do not mean anything if you happen to be on the wrong side of them.  Even if a procedure has a 1 (one) in a 1,000,000 (million) chance of going wrong, it you happen to be that one..Also, keep in mind that by statistics, you have more of a chance of having something go wrong when taking medications.  Who should not have this procedure? If you are on a blood thinning medication (e.g. Coumadin, Plavix, see list of "Blood Thinners"), or if you have an active infection going on, you should not have the procedure.  If you are taking any blood thinners, please inform your physician.  How should I prepare for this procedure?  Do not eat or drink anything at least six hours prior to the procedure.  Bring a driver with you .  It cannot be a taxi.  Come accompanied by an adult that can drive you back, and that is strong enough to help you if your legs get weak or numb from the local anesthetic.  Take all of your medicines the morning of the procedure with just enough water to swallow them.  If you have diabetes, make sure that you are scheduled to have your procedure done first thing in the morning, whenever possible.  If you have diabetes, take only half of your insulin dose and notify our nurse that you have done so as soon as you arrive at the clinic.  If you are diabetic, but only take blood sugar pills (oral hypoglycemic), then do not take them on the morning of your procedure.  You may take them after you have had the  procedure.  Do not take aspirin or any aspirin-containing medications, at least eleven (11) days prior to the procedure.  They may prolong bleeding.  Wear loose fitting clothing that may be easy to take off and that you would not mind if it got stained with Betadine or blood.  Do not wear any jewelry or perfume  Remove any nail coloring.  It will interfere with some of our monitoring equipment.  NOTE: Remember that this is not meant to be interpreted as a complete list of all possible complications.  Unforeseen problems may occur.  BLOOD THINNERS The following drugs contain aspirin or other products, which can cause increased bleeding during surgery and should not be taken for 2 weeks prior to and 1 week after surgery.  If you should need take something for relief of minor  pain, you may take acetaminophen which is found in Tylenol,m Datril, Anacin-3 and Panadol. It is not blood thinner. The products listed below are.  Do not take any of the products listed below in addition to any listed on your instruction sheet.  A.P.C or A.P.C with Codeine Codeine Phosphate Capsules #3 Ibuprofen Ridaura  ABC compound Congesprin Imuran rimadil  Advil Cope Indocin Robaxisal  Alka-Seltzer Effervescent Pain Reliever and Antacid Coricidin or Coricidin-D  Indomethacin Rufen  Alka-Seltzer plus Cold Medicine Cosprin Ketoprofen S-A-C Tablets  Anacin Analgesic Tablets or Capsules Coumadin Korlgesic Salflex  Anacin Extra Strength Analgesic tablets or capsules CP-2 Tablets Lanoril Salicylate  Anaprox Cuprimine Capsules Levenox Salocol  Anexsia-D Dalteparin Magan Salsalate  Anodynos Darvon compound Magnesium Salicylate Sine-off  Ansaid Dasin Capsules Magsal Sodium Salicylate  Anturane Depen Capsules Marnal Soma  APF Arthritis pain formula Dewitt's Pills Measurin Stanback  Argesic Dia-Gesic Meclofenamic Sulfinpyrazone  Arthritis Bayer Timed Release Aspirin Diclofenac Meclomen Sulindac  Arthritis pain formula  Anacin Dicumarol Medipren Supac  Analgesic (Safety coated) Arthralgen Diffunasal Mefanamic Suprofen  Arthritis Strength Bufferin Dihydrocodeine Mepro Compound Suprol  Arthropan liquid Dopirydamole Methcarbomol with Aspirin Synalgos  ASA tablets/Enseals Disalcid Micrainin Tagament  Ascriptin Doan's Midol Talwin  Ascriptin A/D Dolene Mobidin Tanderil  Ascriptin Extra Strength Dolobid Moblgesic Ticlid  Ascriptin with Codeine Doloprin or Doloprin with Codeine Momentum Tolectin  Asperbuf Duoprin Mono-gesic Trendar  Aspergum Duradyne Motrin or Motrin IB Triminicin  Aspirin plain, buffered or enteric coated Durasal Myochrisine Trigesic  Aspirin Suppositories Easprin Nalfon Trillsate  Aspirin with Codeine Ecotrin Regular or Extra Strength Naprosyn Uracel  Atromid-S Efficin Naproxen Ursinus  Auranofin Capsules Elmiron Neocylate Vanquish  Axotal Emagrin Norgesic Verin  Azathioprine Empirin or Empirin with Codeine Normiflo Vitamin E  Azolid Emprazil Nuprin Voltaren  Bayer Aspirin plain, buffered or children's or timed BC Tablets or powders Encaprin Orgaran Warfarin Sodium  Buff-a-Comp Enoxaparin Orudis Zorpin  Buff-a-Comp with Codeine Equegesic Os-Cal-Gesic   Buffaprin Excedrin plain, buffered or Extra Strength Oxalid   Bufferin Arthritis Strength Feldene Oxphenbutazone   Bufferin plain or Extra Strength Feldene Capsules Oxycodone with Aspirin   Bufferin with Codeine Fenoprofen Fenoprofen Pabalate or Pabalate-SF   Buffets II Flogesic Panagesic   Buffinol plain or Extra Strength Florinal or Florinal with Codeine Panwarfarin   Buf-Tabs Flurbiprofen Penicillamine   Butalbital Compound Four-way cold tablets Penicillin   Butazolidin Fragmin Pepto-Bismol   Carbenicillin Geminisyn Percodan   Carna Arthritis Reliever Geopen Persantine   Carprofen Gold's salt Persistin   Chloramphenicol Goody's Phenylbutazone   Chloromycetin Haltrain Piroxlcam   Clmetidine heparin Plaquenil   Cllnoril Hyco-pap  Ponstel   Clofibrate Hydroxy chloroquine Propoxyphen         Before stopping any of these medications, be sure to consult the physician who ordered them.  Some, such as Coumadin (Warfarin) are ordered to prevent or treat serious conditions such as "deep thrombosis", "pumonary embolisms", and other heart problems.  The amount of time that you may need off of the medication may also vary with the medication and the reason for which you were taking it.  If you are taking any of these medications, please make sure you notify your pain physician before you undergo any procedures.         Facet Joint Block The facet joints connect the bones of the spine (vertebrae). They make it possible for you to bend, twist, and make other movements with your spine. They also prevent you from overbending, overtwisting, and making other excessive movements.  A facet  joint block is a procedure where a numbing medicine (anesthetic) is injected into a facet joint. Often, a type of anti-inflammatory medicine called a steroid is also injected. A facet joint block may be done for two reasons:  2. Diagnosis. A facet joint block may be done as a test to see whether neck or back pain is caused by a worn-down or infected facet joint. If the pain gets better after a facet joint block, it means the pain is probably coming from the facet joint. If the pain does not get better, it means the pain is probably not coming from the facet joint.  3. Therapy. A facet joint block may be done to relieve neck or back pain caused by a facet joint. A facet joint block is only done as a therapy if the pain does not improve with medicine, exercise programs, physical therapy, and other forms of pain management. LET [facility] CARE PROVIDER KNOW ABOUT:   Any allergies you have.   All medicines you are taking, including vitamins, herbs, eyedrops, and over-the-counter medicines and creams.   Previous problems you or members of your family  have had with the use of anesthetics.   Any blood disorders you have had.   Other health problems you have. RISKS AND COMPLICATIONS Generally, having a facet joint block is safe. However, as with any procedure, complications can occur. Possible complications associated with having a facet joint block include:   Bleeding.   Injury to a nerve near the injection site.   Pain at the injection site.   Weakness or numbness in areas controlled by nerves near the injection site.   Infection.   Temporary fluid retention.   Allergic reaction to anesthetics or medicines used during the procedure. BEFORE THE PROCEDURE   Follow your health care provider's instructions if you are taking dietary supplements or medicines. You may need to stop taking them or reduce your dosage.   Do not take any new dietary supplements or medicines without asking your health care provider first.   Follow your health care provider's instructions about eating and drinking before the procedure. You may need to stop eating and drinking several hours before the procedure.   Arrange to have an adult drive you home after the procedure. PROCEDURE 12. You may need to remove your clothing and dress in an open-back gown so that your health care provider can access your spine.  13. The procedure will be done while you are lying on an X-ray table. Most of the time you will be asked to lie on your stomach, but you may be asked to lie in a different position if an injection will be made in your neck.  14. Special machines will be used to monitor your oxygen levels, heart rate, and blood pressure.  15. If an injection will be made in your neck, an intravenous (IV) tube will be inserted into one of your veins. Fluids and medicine will flow directly into your body through the IV tube.  16. The area over the facet joint where the injection will be made will be cleaned with an antiseptic soap. The surrounding skin will  be covered with sterile drapes.  17. An anesthetic will be applied to your skin to make the injection area numb. You may feel a temporary stinging or burning sensation.  18. A video X-ray machine will be used to locate the joint. A contrast dye may be injected into the facet joint area to help with locating  the joint.  19. When the joint is located, an anesthetic medicine will be injected into the joint through the needle.  20. Your health care provider will ask you whether you feel pain relief. If you do feel relief, a steroid may be injected to provide pain relief for a longer period of time. If you do not feel relief or feel only partial relief, additional injections of an anesthetic may be made in other facet joints.  21. The needle will be removed, the skin will be cleansed, and bandages will be applied.  AFTER THE PROCEDURE   You will be observed for 15-30 minutes before being allowed to go home. Do not drive. Have an adult drive you or take a taxi or public transportation instead.   If you feel pain relief, the pain will return in several hours or days when the anesthetic wears off.   You may feel pain relief 2-14 days after the procedure. The amount of time this relief lasts varies from person to person.   It is normal to feel some tenderness over the injected area(s) for 2 days following the procedure.   If you have diabetes, you may have a temporary increase in blood sugar. Document Released: 01/12/2007 Document Revised: 01/07/2014 Document Reviewed: 06/12/2012 [facility] Patient Information 2015 [place], [place]. This information is not intended to replace advice given to you by your health care provider. Make sure you discuss any questions you have with your health care provider.

## 2015-04-10 NOTE — Progress Notes (Signed)
Safety precautions to be maintained throughout the outpatient stay will include: orient to surroundings, keep bed in low position, maintain call bell within reach at all times, provide assistance with transfer out of bed and ambulation.  

## 2015-04-10 NOTE — Progress Notes (Signed)
   Subjective:    Patient ID: Martin Golden, male    DOB: [birth year], 64 y.o.   MRN: [id number]  HPI  Patient is 64 year old gentleman [facility] further evaluation and treatment of pain involving the lower back lower extremity region. Patient with pain involving the neck upper extremity regions of lesser degree. Patient had improvement of his pain with previous lumbar facet, medial branch nerve, blocks. We will proceed with interventional treatment at time return appointment in attempt to decrease the lower back and lower extremity pain will significantly. The patient was understanding and in agreement with suggested treatment plan. Patient denies any trauma change in events of daily living the call significant change in symptomatology.     Review of Systems     Objective:   Physical Exam  There was tenderness of the spleen is Occipitalis musculature region of moderate degree. Lateral bending and rotation and extension and palpation of the cervical paraspinal musculature region and cervical region was with moderate discomfort. Patient was with slightly decreased grip strength. Tinel and Phalen's maneuver without increased pain of any significant degree palpation of the cervical facet cervical paraspinal paraspinal musculature region and thoracic facet thoracic paraspinal musculature region was with moderate discomfort. There was muscle spasms of moderate degree of the thoracic region. Palpation over the lumbar paraspinal muscles region lumbar facet region associated with moderate discomfort. Lateral bending and rotation associated with moderate discomfort. There was tenderness of the PSIS and PII S region of moderate degree and moderate tenderness of the gluteal and piriformis musculature region. Straight leg raising was limited to approximately 20 without increased pain with dorsiflexion noted. There was negative clonus negative Homans. Abdomen nontender with no  costovertebral angle tenderness noted.    Assessment & Plan:  Degenerative disc disease lumbar spine Lumbar spondylosis with degenerative disc disease causing multilevel central foraminal and subarticular lateral recess stenosis  Lumbar facet syndrome  Lumbar radiculopathy  Lumbar stenosis  Sacroiliac joint dysfunction  History of right renal cyst with suspected left renal atrophy  Carcinoma of prostate  Degenerative disc disease cervical spine Mild straightening of the normal cervical lordosis. Disc space narrowing at C5-6 and C6-7. Multilevel facet arthropathy, most severe at C3-4 on the left, C4-5 on the left, and C5-6 on the right could CONTRIBUTE to crepitus   Plan   Continue present medication  Lumbar facet, medial branch nerve, blocks to be performed at time return appointment  F/U PCP Dr. Lisette Grinder III for evaliation of  BP and general medical  condition  F/U surgical evaluation. Neurosurgical evaluation has been discussed and will be considered F/U neurological evaluation  May consider radiofrequency rhizolysis or intraspinal procedures pending response to present treatment and F/U evaluation   Patient to call [facility] should patient have concerns prior to scheduled return appointmen.

## 2015-04-11 DIAGNOSIS — E291 Testicular hypofunction: Secondary | ICD-10-CM | POA: Diagnosis not present

## 2015-04-11 DIAGNOSIS — C61 Malignant neoplasm of prostate: Secondary | ICD-10-CM | POA: Diagnosis not present

## 2015-04-16 DIAGNOSIS — F322 Major depressive disorder, single episode, severe without psychotic features: Secondary | ICD-10-CM | POA: Diagnosis not present

## 2015-04-22 ENCOUNTER — Telehealth: Payer: Self-pay | Admitting: Pain Medicine

## 2015-04-22 NOTE — Telephone Encounter (Signed)
Caryl Pina and Juliann Pulse  Thank you for the follow-up

## 2015-04-22 NOTE — Telephone Encounter (Signed)
Per vmail left by patient 04-21-15 at 4:22 he cannot keep appt on Wed 04-23-15 no transportation / he will call back after discussing with wife to resched

## 2015-04-23 ENCOUNTER — Ambulatory Visit: Payer: Medicare Other | Admitting: Pain Medicine

## 2015-04-25 DIAGNOSIS — F322 Major depressive disorder, single episode, severe without psychotic features: Secondary | ICD-10-CM | POA: Diagnosis not present

## 2015-04-28 DIAGNOSIS — C61 Malignant neoplasm of prostate: Secondary | ICD-10-CM | POA: Diagnosis not present

## 2015-04-28 DIAGNOSIS — Z6836 Body mass index (BMI) 36.0-36.9, adult: Secondary | ICD-10-CM | POA: Diagnosis not present

## 2015-04-30 DIAGNOSIS — I10 Essential (primary) hypertension: Secondary | ICD-10-CM | POA: Diagnosis not present

## 2015-04-30 DIAGNOSIS — R635 Abnormal weight gain: Secondary | ICD-10-CM | POA: Diagnosis not present

## 2015-04-30 DIAGNOSIS — Z6838 Body mass index (BMI) 38.0-38.9, adult: Secondary | ICD-10-CM | POA: Diagnosis not present

## 2015-04-30 DIAGNOSIS — F322 Major depressive disorder, single episode, severe without psychotic features: Secondary | ICD-10-CM | POA: Diagnosis not present

## 2015-05-08 DIAGNOSIS — F322 Major depressive disorder, single episode, severe without psychotic features: Secondary | ICD-10-CM | POA: Diagnosis not present

## 2015-05-13 ENCOUNTER — Encounter: Payer: Medicare Other | Admitting: Pain Medicine

## 2015-05-14 ENCOUNTER — Ambulatory Visit: Payer: Medicare Other | Attending: Pain Medicine | Admitting: Pain Medicine

## 2015-05-14 ENCOUNTER — Encounter: Payer: Self-pay | Admitting: Pain Medicine

## 2015-05-14 VITALS — BP 142/72 | HR 87 | Temp 99.0°F | Resp 16 | Ht 73.0 in | Wt 270.0 lb

## 2015-05-14 DIAGNOSIS — M79604 Pain in right leg: Secondary | ICD-10-CM | POA: Diagnosis present

## 2015-05-14 DIAGNOSIS — M47812 Spondylosis without myelopathy or radiculopathy, cervical region: Secondary | ICD-10-CM

## 2015-05-14 DIAGNOSIS — M79605 Pain in left leg: Secondary | ICD-10-CM | POA: Diagnosis present

## 2015-05-14 DIAGNOSIS — M48061 Spinal stenosis, lumbar region without neurogenic claudication: Secondary | ICD-10-CM

## 2015-05-14 DIAGNOSIS — M5030 Other cervical disc degeneration, unspecified cervical region: Secondary | ICD-10-CM

## 2015-05-14 DIAGNOSIS — M47816 Spondylosis without myelopathy or radiculopathy, lumbar region: Secondary | ICD-10-CM

## 2015-05-14 DIAGNOSIS — M4806 Spinal stenosis, lumbar region: Secondary | ICD-10-CM | POA: Insufficient documentation

## 2015-05-14 DIAGNOSIS — M545 Low back pain: Secondary | ICD-10-CM | POA: Diagnosis present

## 2015-05-14 DIAGNOSIS — M5136 Other intervertebral disc degeneration, lumbar region: Secondary | ICD-10-CM | POA: Diagnosis not present

## 2015-05-14 DIAGNOSIS — M5416 Radiculopathy, lumbar region: Secondary | ICD-10-CM

## 2015-05-14 DIAGNOSIS — M51369 Other intervertebral disc degeneration, lumbar region without mention of lumbar back pain or lower extremity pain: Secondary | ICD-10-CM

## 2015-05-14 DIAGNOSIS — M47817 Spondylosis without myelopathy or radiculopathy, lumbosacral region: Secondary | ICD-10-CM | POA: Diagnosis not present

## 2015-05-14 DIAGNOSIS — M533 Sacrococcygeal disorders, not elsewhere classified: Secondary | ICD-10-CM

## 2015-05-14 DIAGNOSIS — Z9889 Other specified postprocedural states: Secondary | ICD-10-CM

## 2015-05-14 MED ORDER — FENTANYL CITRATE (PF) 100 MCG/2ML IJ SOLN
INTRAMUSCULAR | Status: AC
  Administered 2015-05-14: 100 ug via INTRAVENOUS
  Filled 2015-05-14: qty 2

## 2015-05-14 MED ORDER — HYDROCODONE-ACETAMINOPHEN 10-325 MG/15ML PO SOLN: ORAL | Status: DC

## 2015-05-14 MED ORDER — TRIAMCINOLONE ACETONIDE 40 MG/ML IJ SUSP
INTRAMUSCULAR | Status: AC
  Administered 2015-05-14: 12:00:00
  Filled 2015-05-14: qty 1

## 2015-05-14 MED ORDER — BUPIVACAINE HCL (PF) 0.25 % IJ SOLN
INTRAMUSCULAR | Status: AC
  Administered 2015-05-14: 11:00:00
  Filled 2015-05-14: qty 30

## 2015-05-14 MED ORDER — CIPROFLOXACIN IN D5W 400 MG/200ML IV SOLN
INTRAVENOUS | Status: AC
  Administered 2015-05-14: 400 mg via INTRAVASCULAR
  Filled 2015-05-14: qty 200

## 2015-05-14 MED ORDER — MIDAZOLAM HCL 5 MG/5ML IJ SOLN
INTRAMUSCULAR | Status: AC
  Administered 2015-05-14: 5 mg via INTRAVENOUS
  Filled 2015-05-14: qty 5

## 2015-05-14 MED ORDER — HYDROCODONE-ACETAMINOPHEN 7.5-300 MG PO TABS: ORAL_TABLET | ORAL | Status: DC

## 2015-05-14 MED ORDER — CIPROFLOXACIN HCL 250 MG PO TABS: 250.0000 mg | ORAL_TABLET | Freq: Two times a day (BID) | ORAL | Status: DC

## 2015-05-14 MED ORDER — HYDROCODONE-ACETAMINOPHEN 10-325 MG PO TABS: ORAL_TABLET | ORAL | Status: DC

## 2015-05-14 MED ORDER — ORPHENADRINE CITRATE 30 MG/ML IJ SOLN
INTRAMUSCULAR | Status: AC
  Administered 2015-05-14: 11:00:00
  Filled 2015-05-14: qty 2

## 2015-05-14 NOTE — Progress Notes (Signed)
Safety precautions to be maintained throughout the outpatient stay will include: orient to surroundings, keep bed in low position, maintain call bell within reach at all times, provide assistance with transfer out of bed and ambulation.  

## 2015-05-14 NOTE — Progress Notes (Signed)
Subjective:    Patient ID: Martin Golden, male    DOB: [birth year], 64 y.o.   MRN: [id number]  HPI  PROCEDURE PERFORMED: Lumbar facet (medial branch block)   NOTE: The patient is a 64 y.o. male who returns to [facility] for further evaluation and treatment of pain involving the lumbar and lower extremity region. MRI revealed degenerative disc disease lumbar spine with lumbar spondylosis with degenerative disc disease causing multilevel central foraminal and subarticular lateral recess stenosis . There is concern regarding significant component of patient's pain being due to facet arthropathy with facet syndrome  The risks, benefits, and expectations of the procedure have been discussed and explained to the patient who was understanding and in agreement with suggested treatment plan. We will proceed with interventional treatment as discussed and as explained to the patient who was understanding and wished to proceed with procedure as planned.   DESCRIPTION OF PROCEDURE: Lumbar facet (medial branch block) with IV Versed, IV fentanyl conscious sedation, EKG, blood pressure, pulse, and pulse oximetry monitoring. The procedure was performed with the patient in the prone position. Betadine prep of proposed entry site performed.   NEEDLE PLACEMENT AT: Left  L 3  lumbar facet (medial branch block). Under fluoroscopic guidance with oblique orientation of 15 degrees, a 22-gauge needle was inserted at the L 3  vertebral body level with needle placed at the targeted area of Burton's Eye or Eye of the Scotty Dog with documentation of needle placement in the superior and lateral border of targeted area of Burton's Eye or Eye of the Scotty Dog with oblique orientation of 15 degrees. Following documentation of needle placement at the L 3  vertebral body level, needle placement was then accomplished at the L for  vertebral body level.   NEEDLE PLACEMENT AT L4 and L5  VERTEBRAL BODY LEVELS ON THE LEFT  SIDE The procedure was performed at the L4 and L5  vertebral body levels exactly as was performed at the L 3  vertebral body level utilizing the same technique and under fluoroscopic guidance.  NEEDLE PLACEMENT AT THE SACRAL ALA with AP view of the lumbosacral spine. With the patient in the prone position, Betadine prep of proposed entry site accomplished, a 22 gauge needle was inserted in the region of the sacral ala (groove formed by the superior articulating process of S1 and the sacral wing). Following documentation of needle placement at the sacral ala,  needle placement was then accomplished at the S1 foramen level.   NEEDLE PLACEMENT AT THE S1 FORAMEN LEVEL under fluoroscopic guidance with AP view of the lumbosacral spine and cephalad orientation of the fluoroscope, a 22-gauge needle was placed at the superior and lateral border of the S1 foramen under fluoroscopic guidance. Following documentation of needle placement at the S1 foramen.   Needle placement was then verified at all levels on lateral view. Following documentation of needle placement at all levels on lateral view and following negative aspiration for heme and CSF, each level was injected with 1 mL of 0.25% bupivacaine with Kenalog.     LUMBAR FACET, MEDIAL BRANCH NERVE, BLOCKS PERFORMED ON THE RIGHT SIDE   The procedure was performed on the right side exactly as was performed on the left side at the same levels and utilizing the same technique under fluoroscopic guidance.     The patient tolerated the procedure well. A total of 40 mg of Kenalog was utilized for the procedure.   PLAN:  1. Medications: The patient  will continue presently prescribed medication hydrocodone acetaminophen   2. May consider modification of treatment regimen at time of return appointment pending response to treatment rendered on today's visit. 3. The patient is to follow-up with primary care physician Dr. Lisette Grinder  III for further evaluation  of blood pressure and general medical condition status post steroid injection performed on today's visit. 4. Surgical follow-up evaluation . We will discuss further surgical evaluation pending follow-up evaluation  5. Neurological follow-up evaluation.. May consider [facility] EMG studies and other studies  6. The patient may be candidate for radiofrequency procedures, implantation type procedures, and other treatment pending response to treatment and follow-up evaluation. 7. The patient has been advised to call the [facility] prior to scheduled return appointment should there be significant change in condition or should patient have other concerns regarding condition prior to scheduled return appointment.  The patient is understanding and in agreement with suggested treatment plan.   Review of Systems     Objective:   Physical Exam        Assessment & Plan:

## 2015-05-14 NOTE — Patient Instructions (Addendum)
PLAN  Continue present medication hydrocodone acetaminophen and begin taking antibiotic Cipro as prescribed. Please obtain your antibiotic Cipro today and begin taking antibiotic today  Please ask Angie and Caryl Pina if you have been approved for radiofrequency rhizolysis lumbar facets medial branch nerves  F/U PCP Dr. Lisette Grinder III for  evaliation of  BP and general medical  condition.  F/U surgical evaluation. May consider pending follow-up evaluations  F/U neurological evaluation. May consider pending follow-up evaluations  May consider radiofrequency rhizolysis or intraspinal procedures pending response to present treatment and F/U evaluation.  Patient to call [facility] should patient have concerns prior to scheduled return appointment.  Pain Management Discharge Instructions  General Discharge Instructions :  If you need to reach your doctor call: Monday-Friday 8:00 am - 4:00 pm at [phone] or toll free [phone].  After clinic hours [phone] to have operator reach doctor.  Bring all of your medication bottles to all your appointments in the pain clinic.  To cancel or reschedule your appointment with Pain Management please remember to call 24 hours in advance to avoid a fee.  Refer to the educational materials which you have been given on: General Risks, I had my Procedure. Discharge Instructions, Post Sedation.  Post Procedure Instructions:  The drugs you were given will stay in your system until tomorrow, so for the next 24 hours you should not drive, make any legal decisions or drink any alcoholic beverages.  You may eat anything you prefer, but it is better to start with liquids then soups and crackers, and gradually work up to solid foods.  Please notify your doctor immediately if you have any unusual bleeding, trouble breathing or pain that is not related to your normal pain.  Depending on the type of procedure that was done, some parts of your  body may feel week and/or numb.  This usually clears up by tonight or the next day.  Walk with the use of an assistive device or accompanied by an adult for the 24 hours.  You may use ice on the affected area for the first 24 hours.  Put ice in a Ziploc bag and cover with a towel and place against area 15 minutes on 15 minutes off.  You may switch to heat after 24 hours.Facet Joint Block The facet joints connect the bones of the spine (vertebrae). They make it possible for you to bend, twist, and make other movements with your spine. They also prevent you from overbending, overtwisting, and making other excessive movements.  A facet joint block is a procedure where a numbing medicine (anesthetic) is injected into a facet joint. Often, a type of anti-inflammatory medicine called a steroid is also injected. A facet joint block may be done for two reasons:   Diagnosis. A facet joint block may be done as a test to see whether neck or back pain is caused by a worn-down or infected facet joint. If the pain gets better after a facet joint block, it means the pain is probably coming from the facet joint. If the pain does not get better, it means the pain is probably not coming from the facet joint.   Therapy. A facet joint block may be done to relieve neck or back pain caused by a facet joint. A facet joint block is only done as a therapy if the pain does not improve with medicine, exercise programs, physical therapy, and other forms of pain management. LET [facility] CARE PROVIDER KNOW ABOUT:  Any allergies you have.   All medicines you are taking, including vitamins, herbs, eyedrops, and over-the-counter medicines and creams.   Previous problems you or members of your family have had with the use of anesthetics.   Any blood disorders you have had.   Other health problems you have. RISKS AND COMPLICATIONS Generally, having a facet joint block is safe. However, as with any procedure,  complications can occur. Possible complications associated with having a facet joint block include:   Bleeding.   Injury to a nerve near the injection site.   Pain at the injection site.   Weakness or numbness in areas controlled by nerves near the injection site.   Infection.   Temporary fluid retention.   Allergic reaction to anesthetics or medicines used during the procedure. BEFORE THE PROCEDURE   Follow your health care provider's instructions if you are taking dietary supplements or medicines. You may need to stop taking them or reduce your dosage.   Do not take any new dietary supplements or medicines without asking your health care provider first.   Follow your health care provider's instructions about eating and drinking before the procedure. You may need to stop eating and drinking several hours before the procedure.   Arrange to have an adult drive you home after the procedure. PROCEDURE  You may need to remove your clothing and dress in an open-back gown so that your health care provider can access your spine.   The procedure will be done while you are lying on an X-ray table. Most of the time you will be asked to lie on your stomach, but you may be asked to lie in a different position if an injection will be made in your neck.   Special machines will be used to monitor your oxygen levels, heart rate, and blood pressure.   If an injection will be made in your neck, an intravenous (IV) tube will be inserted into one of your veins. Fluids and medicine will flow directly into your body through the IV tube.   The area over the facet joint where the injection will be made will be cleaned with an antiseptic soap. The surrounding skin will be covered with sterile drapes.   An anesthetic will be applied to your skin to make the injection area numb. You may feel a temporary stinging or burning sensation.   A video X-ray machine will be used to locate the joint.  A contrast dye may be injected into the facet joint area to help with locating the joint.   When the joint is located, an anesthetic medicine will be injected into the joint through the needle.   Your health care provider will ask you whether you feel pain relief. If you do feel relief, a steroid may be injected to provide pain relief for a longer period of time. If you do not feel relief or feel only partial relief, additional injections of an anesthetic may be made in other facet joints.   The needle will be removed, the skin will be cleansed, and bandages will be applied.  AFTER THE PROCEDURE   You will be observed for 15-30 minutes before being allowed to go home. Do not drive. Have an adult drive you or take a taxi or public transportation instead.   If you feel pain relief, the pain will return in several hours or days when the anesthetic wears off.   You may feel pain relief 2-14 days after the procedure. The amount  of time this relief lasts varies from person to person.   It is normal to feel some tenderness over the injected area(s) for 2 days following the procedure.   If you have diabetes, you may have a temporary increase in blood sugar. Document Released: 01/12/2007 Document Revised: 01/07/2014 Document Reviewed: 06/12/2012 [facility] Patient Information 2015 [place], [place]. This information is not intended to replace advice given to you by your health care provider. Make sure you discuss any questions you have with your health care provider.

## 2015-05-15 ENCOUNTER — Telehealth: Payer: Self-pay

## 2015-05-15 NOTE — Telephone Encounter (Signed)
LM

## 2015-05-26 DIAGNOSIS — F322 Major depressive disorder, single episode, severe without psychotic features: Secondary | ICD-10-CM | POA: Diagnosis not present

## 2015-06-12 ENCOUNTER — Encounter: Payer: Self-pay | Admitting: Pain Medicine

## 2015-06-12 ENCOUNTER — Ambulatory Visit: Payer: Medicare Other | Attending: Pain Medicine | Admitting: Pain Medicine

## 2015-06-12 VITALS — BP 111/62 | HR 97 | Temp 97.6°F | Resp 16 | Ht 73.0 in | Wt 265.0 lb

## 2015-06-12 DIAGNOSIS — M5136 Other intervertebral disc degeneration, lumbar region: Secondary | ICD-10-CM | POA: Insufficient documentation

## 2015-06-12 DIAGNOSIS — M545 Low back pain: Secondary | ICD-10-CM | POA: Diagnosis present

## 2015-06-12 DIAGNOSIS — M4806 Spinal stenosis, lumbar region: Secondary | ICD-10-CM | POA: Diagnosis not present

## 2015-06-12 DIAGNOSIS — M533 Sacrococcygeal disorders, not elsewhere classified: Secondary | ICD-10-CM

## 2015-06-12 DIAGNOSIS — M791 Myalgia: Secondary | ICD-10-CM | POA: Diagnosis not present

## 2015-06-12 DIAGNOSIS — M47812 Spondylosis without myelopathy or radiculopathy, cervical region: Secondary | ICD-10-CM

## 2015-06-12 DIAGNOSIS — M79604 Pain in right leg: Secondary | ICD-10-CM | POA: Diagnosis present

## 2015-06-12 DIAGNOSIS — M47816 Spondylosis without myelopathy or radiculopathy, lumbar region: Secondary | ICD-10-CM

## 2015-06-12 DIAGNOSIS — M5416 Radiculopathy, lumbar region: Secondary | ICD-10-CM | POA: Diagnosis not present

## 2015-06-12 DIAGNOSIS — M79605 Pain in left leg: Secondary | ICD-10-CM | POA: Diagnosis present

## 2015-06-12 DIAGNOSIS — M5030 Other cervical disc degeneration, unspecified cervical region: Secondary | ICD-10-CM

## 2015-06-12 DIAGNOSIS — M51369 Other intervertebral disc degeneration, lumbar region without mention of lumbar back pain or lower extremity pain: Secondary | ICD-10-CM

## 2015-06-12 DIAGNOSIS — M4726 Other spondylosis with radiculopathy, lumbar region: Secondary | ICD-10-CM | POA: Insufficient documentation

## 2015-06-12 DIAGNOSIS — M48061 Spinal stenosis, lumbar region without neurogenic claudication: Secondary | ICD-10-CM

## 2015-06-12 DIAGNOSIS — M48062 Spinal stenosis, lumbar region with neurogenic claudication: Secondary | ICD-10-CM

## 2015-06-12 DIAGNOSIS — M47817 Spondylosis without myelopathy or radiculopathy, lumbosacral region: Secondary | ICD-10-CM | POA: Diagnosis not present

## 2015-06-12 DIAGNOSIS — Z9889 Other specified postprocedural states: Secondary | ICD-10-CM

## 2015-06-12 MED ORDER — HYDROCODONE-ACETAMINOPHEN 10-325 MG/15ML PO SOLN: ORAL | Status: DC

## 2015-06-12 NOTE — Progress Notes (Signed)
Safety precautions to be maintained throughout the outpatient stay will include: orient to surroundings, keep bed in low position, maintain call bell within reach at all times, provide assistance with transfer out of bed and ambulation.  

## 2015-06-12 NOTE — Patient Instructions (Addendum)
PLAN   Continue present medication: Hydrocodone acetaminophen   Block of nerves to the sacroiliac joint to be performed at time of return appointment  F/U PCP Dr. Lisette Grinder III for evaliation of  BP and general medical  condition  F/U surgical evaluation. May consider pending follow-up evaluations  F/U neurological evaluation. May consider pending follow-up evaluations  May consider radiofrequency rhizolysis or intraspinal procedures pending response to present treatment and F/U evaluation   Patient to call Pain Management Center should patient have concerns prior to scheduled return appointment.  Sacroiliac (SI) Joint Injection Patient Information  Description: The sacroiliac joint connects the scrum (very low back and tailbone) to the ilium (a pelvic bone which also forms half of the hip joint).  Normally this joint experiences very little motion.  When this joint becomes inflamed or unstable low back and or hip and pelvis pain may result.  Injection of this joint with local anesthetics (numbing medicines) and steroids can provide diagnostic information and reduce pain.  This injection is performed with the aid of x-ray guidance into the tailbone area while you are lying on your stomach.   You may experience an electrical sensation down the leg while this is being done.  You may also experience numbness.  We also may ask if we are reproducing your normal pain during the injection.  Conditions which may be treated SI injection:   Low back, buttock, hip or leg pain  Preparation for the Injection:  1. Do not eat any solid food or dairy products within 6 hours of your appointment.  2. You may drink clear liquids up to 2 hours before appointment.  Clear liquids include water, black coffee, juice or soda.  No milk or cream please. 3. You may take your regular medications, including pain medications with a sip of water before your appointment.  Diabetics should hold regular insulin (if  take separately) and take 1/2 normal NPH dose the morning of the procedure.  Carry some sugar containing items with you to your appointment. 4. A driver must accompany you and be prepared to drive you home after your procedure. 5. Bring all of your current medications with you. 6. An IV may be inserted and sedation may be given at the discretion of the physician. 7. A blood pressure cuff, EKG and other monitors will often be applied during the procedure.  Some patients may need to have extra oxygen administered for a short period.  8. You will be asked to provide medical information, including your allergies, prior to the procedure.  We must know immediately if you are taking blood thinners (like Coumadin/Warfarin) or if you are allergic to IV iodine contrast (dye).  We must know if you could possible be pregnant.  Possible side effects:   Bleeding from needle site  Infection (rare, may require surgery)  Nerve injury (rare)  Numbness & tingling (temporary)  A brief convulsion or seizure  Light-headedness (temporary)  Pain at injection site (several days)  Decreased blood pressure (temporary)  Weakness in the leg (temporary)   Call if you experience:   New onset weakness or numbness of an extremity below the injection site that last more than 8 hours.  Hives or difficulty breathing ( go to the emergency room)  Inflammation or drainage at the injection site  Any new symptoms which are concerning to you  Please note:  Although the local anesthetic injected can often make your back/ hip/ buttock/ leg feel good for several hours after  the injections, the pain will likely return.  It takes 3-7 days for steroids to work in the sacroiliac area.  You may not notice any pain relief for at least that one week.  If effective, we will often do a series of three injections spaced 3-6 weeks apart to maximally decrease your pain.  After the initial series, we generally will wait some  months before a repeat injection of the same type.  If you have any questions, please call [phone] [facility]

## 2015-06-12 NOTE — Progress Notes (Signed)
   Subjective:    Patient ID: Martin Golden, male    DOB: Aug 16, 1951, 64 y.o.   MRN: [id number]  HPI Patient is 64 year old gentleman returns to [facility] Management Center for further evaluation and treatment of pain involving the lumbar lower extremity region predominantly patient with pain of the cervical region of lesser degree. Patient states that his pain radiates from the lumbar region to the buttocks on the left as well as on the right. Pain becomes more intense as the day progresses. Lateral bending and rotation and climbing stairs aggravates patient's condition significantly. Patient without trauma change in events of daily living the call significant change in symptomatology. We discussed patient's condition and will continue present medication hydrocodone acetaminophen and we will proceed with block of nerves to the sacroiliac joint at time return appointment in attempt to decrease severity of symptoms minimize progression of symptoms and avoid need for more involved treatment. The patient was with understanding and in agreement to treatment plan.   Review of Systems     Objective:   Physical Exam  There was mild tenderness of the splenius capitis and occipitalis musculature region as well as the cervical facet cervical paraspinal musculature region. There was mild tenderness over the thoracic facet thoracic paraspinal musculature region. Palpation of the acromial clavicular and glenohumeral joint regions reproduced mild discomfort. There was unremarkable Spurling's maneuver. Patient appeared to be with bilaterally equal grip strength. Tinel and Phalen's maneuver without increased pain of significant degree. Palpation over the region of the lumbar paraspinal muscles lumbar facet region associated take to palpation of moderate degree. Rotation associated with moderate discomfort. There was moderate to moderately severe tenderness to palpation of the PSIS PII S region as well as the gluteal and  piriformis musculature region. There was moderately severe increase of pain with pressure applied to the ilium with patient in lateral decubitus position. Patient appeared to be with positive Patrick's maneuver. Straight leg raising was tolerates approximately 30 without increased pain with dorsiflexion noted. There was negative clonus negative Homans. DTRs difficult to elicit patient had difficulty relaxing. No definite sensory deficit of dermatomal distribution detected. Native clonus negative Homans. Abdomen nontender with no costovertebral angle tenderness noted.      Assessment & Plan:    Sacroiliac joint dysfunction  Degenerative disc disease lumbar spine Lumbar spondylosis with degenerative disc disease causing multilevel central foraminal and subarticular lateral recess stenosis  Lumbar facet syndrome  Lumbar radiculopathy  Lumbar stenosis  History of right renal cyst with suspected left renal atrophy    PLAN   Continue present medication hydrocodone acetaminophen  Block of nerves to the sacroiliac joint to be performed at time return appointment  F/U PCP Dr. Lerry Paterson  for evaliation of  BP and general medical  condition  F/U surgical evaluation. May consider pending follow-up evaluations  F/U neurological evaluation. May consider pending follow-up evaluations  May consider radiofrequency rhizolysis or intraspinal procedures pending response to present treatment and F/U evaluation   Patient to call Pain Management Center should patient have concerns prior to scheduled return appointment.

## 2015-07-01 ENCOUNTER — Other Ambulatory Visit: Payer: Self-pay | Admitting: Pain Medicine

## 2015-07-09 DIAGNOSIS — F322 Major depressive disorder, single episode, severe without psychotic features: Secondary | ICD-10-CM | POA: Diagnosis not present

## 2015-07-10 ENCOUNTER — Encounter: Payer: Self-pay | Admitting: Pain Medicine

## 2015-07-10 ENCOUNTER — Ambulatory Visit: Payer: Medicare Other | Attending: Pain Medicine | Admitting: Pain Medicine

## 2015-07-10 VITALS — BP 128/97 | HR 100 | Temp 98.7°F | Resp 15 | Ht 73.0 in | Wt 270.0 lb

## 2015-07-10 DIAGNOSIS — M5030 Other cervical disc degeneration, unspecified cervical region: Secondary | ICD-10-CM

## 2015-07-10 DIAGNOSIS — Z23 Encounter for immunization: Secondary | ICD-10-CM | POA: Diagnosis not present

## 2015-07-10 DIAGNOSIS — M5416 Radiculopathy, lumbar region: Secondary | ICD-10-CM

## 2015-07-10 DIAGNOSIS — M5136 Other intervertebral disc degeneration, lumbar region: Secondary | ICD-10-CM | POA: Insufficient documentation

## 2015-07-10 DIAGNOSIS — M4806 Spinal stenosis, lumbar region: Secondary | ICD-10-CM | POA: Insufficient documentation

## 2015-07-10 DIAGNOSIS — M533 Sacrococcygeal disorders, not elsewhere classified: Secondary | ICD-10-CM | POA: Insufficient documentation

## 2015-07-10 DIAGNOSIS — Z9889 Other specified postprocedural states: Secondary | ICD-10-CM

## 2015-07-10 DIAGNOSIS — I10 Essential (primary) hypertension: Secondary | ICD-10-CM | POA: Diagnosis not present

## 2015-07-10 DIAGNOSIS — M47812 Spondylosis without myelopathy or radiculopathy, cervical region: Secondary | ICD-10-CM

## 2015-07-10 DIAGNOSIS — M791 Myalgia: Secondary | ICD-10-CM | POA: Diagnosis not present

## 2015-07-10 DIAGNOSIS — C61 Malignant neoplasm of prostate: Secondary | ICD-10-CM | POA: Diagnosis not present

## 2015-07-10 DIAGNOSIS — M47816 Spondylosis without myelopathy or radiculopathy, lumbar region: Secondary | ICD-10-CM

## 2015-07-10 DIAGNOSIS — M545 Low back pain: Secondary | ICD-10-CM | POA: Diagnosis present

## 2015-07-10 DIAGNOSIS — M47896 Other spondylosis, lumbar region: Secondary | ICD-10-CM | POA: Diagnosis not present

## 2015-07-10 DIAGNOSIS — E663 Overweight: Secondary | ICD-10-CM | POA: Diagnosis not present

## 2015-07-10 DIAGNOSIS — M48062 Spinal stenosis, lumbar region with neurogenic claudication: Secondary | ICD-10-CM

## 2015-07-10 DIAGNOSIS — Z6838 Body mass index (BMI) 38.0-38.9, adult: Secondary | ICD-10-CM | POA: Diagnosis not present

## 2015-07-10 DIAGNOSIS — M47817 Spondylosis without myelopathy or radiculopathy, lumbosacral region: Secondary | ICD-10-CM | POA: Diagnosis not present

## 2015-07-10 DIAGNOSIS — N281 Cyst of kidney, acquired: Secondary | ICD-10-CM | POA: Insufficient documentation

## 2015-07-10 DIAGNOSIS — M48061 Spinal stenosis, lumbar region without neurogenic claudication: Secondary | ICD-10-CM

## 2015-07-10 DIAGNOSIS — R635 Abnormal weight gain: Secondary | ICD-10-CM | POA: Diagnosis not present

## 2015-07-10 MED ORDER — HYDROCODONE-ACETAMINOPHEN 10-325 MG PO TABS: ORAL_TABLET | ORAL | Status: DC

## 2015-07-10 NOTE — Patient Instructions (Addendum)
PLAN   Continue present medication: Hydrocodone acetaminophen  Block of nerves to sacroiliac joint next appointment   F/U PCP Dr. Lisette Grinder III for evaliation of  BP and general medical  condition  F/U surgical evaluation. May consider pending follow-up evaluations  F/U neurological evaluation. May consider pending follow-up evaluations  May consider radiofrequency rhizolysis or intraspinal procedures pending response to present treatment and F/U evaluation   Patient to call Pain Management Center should patient have concerns prior to scheduled return appointment.  Sacroiliac (SI) Joint Injection Patient Information  Description: The sacroiliac joint connects the scrum (very low back and tailbone) to the ilium (a pelvic bone which also forms half of the hip joint).  Normally this joint experiences very little motion.  When this joint becomes inflamed or unstable low back and or hip and pelvis pain may result.  Injection of this joint with local anesthetics (numbing medicines) and steroids can provide diagnostic information and reduce pain.  This injection is performed with the aid of x-ray guidance into the tailbone area while you are lying on your stomach.   You may experience an electrical sensation down the leg while this is being done.  You may also experience numbness.  We also may ask if we are reproducing your normal pain during the injection.  Conditions which may be treated SI injection:   Low back, buttock, hip or leg pain  Preparation for the Injection:  1. Do not eat any solid food or dairy products within 6 hours of your appointment.  2. You may drink clear liquids up to 2 hours before appointment.  Clear liquids include water, black coffee, juice or soda.  No milk or cream please. 3. You may take your regular medications, including pain medications with a sip of water before your appointment.  Diabetics should hold regular insulin (if take separately) and take 1/2  normal NPH dose the morning of the procedure.  Carry some sugar containing items with you to your appointment. 4. A driver must accompany you and be prepared to drive you home after your procedure. 5. Bring all of your current medications with you. 6. An IV may be inserted and sedation may be given at the discretion of the physician. 7. A blood pressure cuff, EKG and other monitors will often be applied during the procedure.  Some patients may need to have extra oxygen administered for a short period.  8. You will be asked to provide medical information, including your allergies, prior to the procedure.  We must know immediately if you are taking blood thinners (like Coumadin/Warfarin) or if you are allergic to IV iodine contrast (dye).  We must know if you could possible be pregnant.  Possible side effects:   Bleeding from needle site  Infection (rare, may require surgery)  Nerve injury (rare)  Numbness & tingling (temporary)  A brief convulsion or seizure  Light-headedness (temporary)  Pain at injection site (several days)  Decreased blood pressure (temporary)  Weakness in the leg (temporary)   Call if you experience:   New onset weakness or numbness of an extremity below the injection site that last more than 8 hours.  Hives or difficulty breathing ( go to the emergency room)  Inflammation or drainage at the injection site  Any new symptoms which are concerning to you  Please note:  Although the local anesthetic injected can often make your back/ hip/ buttock/ leg feel good for several hours after the injections, the pain will likely return.  It takes 3-7 days for steroids to work in the sacroiliac area.  You may not notice any pain relief for at least that one week.  If effective, we will often do a series of three injections spaced 3-6 weeks apart to maximally decrease your pain.  After the initial series, we generally will wait some months before a repeat injection  of the same type.  If you have any questions, please call [phone] [facility] A prescription for HYDROCODONE was given to you today.

## 2015-07-10 NOTE — Progress Notes (Signed)
   Subjective:    Patient ID: Martin Golden, male    DOB: [birth year], 64 y.o.   MRN: [id number]  HPI   The patient is a 64 year old gentleman with pain involving the lower back and lower extremity regions predominantly. Patient states the pain radiates to the lower back. The buttocks on the left and the right with the right side being more severe in terms of intensity of pain. The pain is aggravated by standing and walking climbing stairs and twisting and turning maneuvers. We will proceed with block of nerves to the sacroiliac joint at time of return appointment in an attempt to decrease severity of patient's symptoms, minimize progression of symptoms, and avoid the need for more involved treatment. The patient will continue hydrocodone acetaminophen and we will consider additional modifications of treatment and response to treatment and follow-up evaluation. The patient was in agreement with suggested treatment plan      Review of Systems     Objective:   Physical Exam   There was tenderness of the splenius capitis at the talus muscular transient amount agreed with mild tenderness of the cervical facet cervical paraspinal musculature region as well as the thoracic facet thoracic paraspinal musculature region without crepitus of the thoracic region noted. The patient appeared to be unremarkable Spurling's maneuver. There was tenderness to palpation over the region of the thoracic facet thoracic paraspinal musculature of the lower thoracic region with moderate muscle spasm noted. Palpation over the lumbar paraspinal muscles on biopsy region was a moderate to moderately severe discomfort right greater than left with severe tenderness to palpation of the PSIS and S region of the right compared to the left. There was tenderness to palpation of the gluteal and piriformis musculature is greater on the right than on the left as well there was mild to moderate tenderness of the greater trochanteric region  and iliotibial band region. Straight leg raising was limited to approximately 20 without increased pain with dorsiflexion noted. No sensory deficit or dermatomal distribution was detected. There was negative clonus and negative Homans. Abdomen was nontender and no costovertebral tenderness was noted.      Assessment & Plan:   Sacroiliac joint dysfunction  Degenerative disc disease lumbar spine Lumbar spondylosis with degenerative disc disease causing multilevel central foraminal and subarticular lateral recess stenosis  Lumbar facet syndrome  Lumbar radiculopathy  Lumbar stenosis  History of right renal cyst with suspected left renal atrophy  Carcinoma of prostate    PLAN   Continue present medication hydrocodone acetaminophen   Block of the nerves to the sacroiliac joint to be performed at time of return appointment  F/U PCP Dr. Lisette Grinder III for evaliation of  BP and general medical  condition  F/U surgical evaluation. Neurosurgical evaluation has been discussed and will be considered F/U neurological evaluation  May consider radiofrequency rhizolysis or intraspinal procedures pending response to present treatment and F/U evaluation   Patient to call Pain Management Center should patient have concerns prior to scheduled return appointmen.

## 2015-07-10 NOTE — Progress Notes (Signed)
Safety precautions to be maintained throughout the outpatient stay will include: orient to surroundings, keep bed in low position, maintain call bell within reach at all times, provide assistance with transfer out of bed and ambulation.  

## 2015-07-15 DIAGNOSIS — F322 Major depressive disorder, single episode, severe without psychotic features: Secondary | ICD-10-CM | POA: Diagnosis not present

## 2015-07-30 DIAGNOSIS — I10 Essential (primary) hypertension: Secondary | ICD-10-CM | POA: Diagnosis not present

## 2015-07-30 DIAGNOSIS — E7800 Pure hypercholesterolemia, unspecified: Secondary | ICD-10-CM | POA: Diagnosis not present

## 2015-08-06 ENCOUNTER — Ambulatory Visit: Payer: Medicare Other | Attending: Pain Medicine | Admitting: Pain Medicine

## 2015-08-06 ENCOUNTER — Encounter: Payer: Self-pay | Admitting: Pain Medicine

## 2015-08-06 VITALS — BP 115/66 | HR 85 | Temp 97.6°F | Resp 16 | Ht 73.0 in | Wt 270.0 lb

## 2015-08-06 DIAGNOSIS — Z9889 Other specified postprocedural states: Secondary | ICD-10-CM

## 2015-08-06 DIAGNOSIS — M47816 Spondylosis without myelopathy or radiculopathy, lumbar region: Secondary | ICD-10-CM | POA: Diagnosis not present

## 2015-08-06 DIAGNOSIS — M48061 Spinal stenosis, lumbar region without neurogenic claudication: Secondary | ICD-10-CM

## 2015-08-06 DIAGNOSIS — M79605 Pain in left leg: Secondary | ICD-10-CM | POA: Diagnosis present

## 2015-08-06 DIAGNOSIS — M48062 Spinal stenosis, lumbar region with neurogenic claudication: Secondary | ICD-10-CM

## 2015-08-06 DIAGNOSIS — M533 Sacrococcygeal disorders, not elsewhere classified: Secondary | ICD-10-CM

## 2015-08-06 DIAGNOSIS — M545 Low back pain: Secondary | ICD-10-CM | POA: Diagnosis not present

## 2015-08-06 DIAGNOSIS — M79604 Pain in right leg: Secondary | ICD-10-CM | POA: Diagnosis present

## 2015-08-06 DIAGNOSIS — M47817 Spondylosis without myelopathy or radiculopathy, lumbosacral region: Secondary | ICD-10-CM | POA: Diagnosis not present

## 2015-08-06 DIAGNOSIS — M47812 Spondylosis without myelopathy or radiculopathy, cervical region: Secondary | ICD-10-CM

## 2015-08-06 DIAGNOSIS — M5136 Other intervertebral disc degeneration, lumbar region: Secondary | ICD-10-CM | POA: Diagnosis not present

## 2015-08-06 DIAGNOSIS — M5030 Other cervical disc degeneration, unspecified cervical region: Secondary | ICD-10-CM

## 2015-08-06 DIAGNOSIS — M5416 Radiculopathy, lumbar region: Secondary | ICD-10-CM

## 2015-08-06 MED ORDER — ORPHENADRINE CITRATE 30 MG/ML IJ SOLN
60.0000 mg | Freq: Once | INTRAMUSCULAR | Status: AC
  Administered 2015-08-06: 60 mg via INTRAMUSCULAR

## 2015-08-06 MED ORDER — CIPROFLOXACIN HCL 250 MG PO TABS: 250.0000 mg | ORAL_TABLET | Freq: Two times a day (BID) | ORAL | Status: DC

## 2015-08-06 MED ORDER — MIDAZOLAM HCL 5 MG/5ML IJ SOLN
5.0000 mg | Freq: Once | INTRAMUSCULAR | Status: AC
  Administered 2015-08-06: 5 mg via INTRAVENOUS

## 2015-08-06 MED ORDER — CIPROFLOXACIN IN D5W 400 MG/200ML IV SOLN
INTRAVENOUS | Status: AC
  Administered 2015-08-06: 400 mg via INTRAVENOUS
  Filled 2015-08-06: qty 200

## 2015-08-06 MED ORDER — ORPHENADRINE CITRATE 30 MG/ML IJ SOLN
INTRAMUSCULAR | Status: AC
Start: 2015-08-06 — End: 2015-08-06
  Administered 2015-08-06: 60 mg via INTRAMUSCULAR
  Filled 2015-08-06: qty 2

## 2015-08-06 MED ORDER — FENTANYL CITRATE (PF) 100 MCG/2ML IJ SOLN
100.0000 ug | Freq: Once | INTRAMUSCULAR | Status: AC
  Administered 2015-08-06: 100 ug via INTRAVENOUS

## 2015-08-06 MED ORDER — CIPROFLOXACIN IN D5W 400 MG/200ML IV SOLN
400.0000 mg | Freq: Once | INTRAVENOUS | Status: AC
  Administered 2015-08-06: 400 mg via INTRAVENOUS

## 2015-08-06 MED ORDER — BUPIVACAINE HCL (PF) 0.25 % IJ SOLN
30.0000 mL | Freq: Once | INTRAMUSCULAR | Status: AC
  Administered 2015-08-06: 30 mg

## 2015-08-06 MED ORDER — BUPIVACAINE HCL (PF) 0.25 % IJ SOLN
INTRAMUSCULAR | Status: AC
  Administered 2015-08-06: 30 mg
  Filled 2015-08-06: qty 30

## 2015-08-06 MED ORDER — TRIAMCINOLONE ACETONIDE 40 MG/ML IJ SUSP
40.0000 mg | Freq: Once | INTRAMUSCULAR | Status: AC
  Administered 2015-08-06: 40 mg

## 2015-08-06 MED ORDER — HYDROCODONE-ACETAMINOPHEN 10-325 MG PO TABS: ORAL_TABLET | ORAL | Status: DC

## 2015-08-06 MED ORDER — LACTATED RINGERS IV SOLN: 1000.0000 mL | INTRAVENOUS | Status: DC

## 2015-08-06 MED ORDER — FENTANYL CITRATE (PF) 100 MCG/2ML IJ SOLN
INTRAMUSCULAR | Status: AC
  Administered 2015-08-06: 100 ug via INTRAVENOUS
  Filled 2015-08-06: qty 2

## 2015-08-06 MED ORDER — TRIAMCINOLONE ACETONIDE 40 MG/ML IJ SUSP
INTRAMUSCULAR | Status: AC
  Administered 2015-08-06: 40 mg
  Filled 2015-08-06: qty 1

## 2015-08-06 MED ORDER — MIDAZOLAM HCL 5 MG/5ML IJ SOLN
INTRAMUSCULAR | Status: AC
  Administered 2015-08-06: 5 mg via INTRAVENOUS
  Filled 2015-08-06: qty 5

## 2015-08-06 NOTE — Patient Instructions (Addendum)
PLAN  Continue present medication hydrocodone acetaminophen and begin taking antibiotic Cipro as prescribed. Please obtain your antibiotic Cipro today and begin taking antibiotic today  Please ask Angie and Caryl Pina if you have been approved for radiofrequency rhizolysis lumbar facets medial branch nerves  F/U PCP Dr. Lisette Grinder III for  evaliation of  BP and general medical  condition.  F/U surgical evaluation. May consider pending follow-up evaluations  F/U neurological evaluation. May consider pending follow-up evaluations  May consider radiofrequency rhizolysis or intraspinal procedures pending response to present treatment and F/U evaluation.  Patient to call [facility] Management Center should patient have concerns prior to scheduled return appointment. Pain Management Discharge Instructions  General Discharge Instructions :  If you need to reach your doctor call: Monday-Friday 8:00 am - 4:00 pm at [phone] or toll free [phone].  After clinic hours [phone] to have operator reach doctor.  Bring all of your medication bottles to all your appointments in the pain clinic.  To cancel or reschedule your appointment with Pain Management please remember to call 24 hours in advance to avoid a fee.  Refer to the educational materials which you have been given on: General Risks, I had my Procedure. Discharge Instructions, Post Sedation.  Post Procedure Instructions:  The drugs you were given will stay in your system until tomorrow, so for the next 24 hours you should not drive, make any legal decisions or drink any alcoholic beverages.  You may eat anything you prefer, but it is better to start with liquids then soups and crackers, and gradually work up to solid foods.  Please notify your doctor immediately if you have any unusual bleeding, trouble breathing or pain that is not related to your normal pain.  Depending on the type of procedure that was done, some parts of your  body may feel week and/or numb.  This usually clears up by tonight or the next day.  Walk with the use of an assistive device or accompanied by an adult for the 24 hours.  You may use ice on the affected area for the first 24 hours.  Put ice in a Ziploc bag and cover with a towel and place against area 15 minutes on 15 minutes off.  You may switch to heat after 24 hours.GENERAL RISKS AND COMPLICATIONS  What are the risk, side effects and possible complications? Generally speaking, most procedures are safe.  However, with any procedure there are risks, side effects, and the possibility of complications.  The risks and complications are dependent upon the sites that are lesioned, or the type of nerve block to be performed.  The closer the procedure is to the spine, the more serious the risks are.  Great care is taken when placing the radio frequency needles, block needles or lesioning probes, but sometimes complications can occur. 1. Infection: Any time there is an injection through the skin, there is a risk of infection.  This is why sterile conditions are used for these blocks.  There are four possible types of infection. 1. Localized skin infection. 2. Central Nervous System Infection-This can be in the form of Meningitis, which can be deadly. 3. Epidural Infections-This can be in the form of an epidural abscess, which can cause pressure inside of the spine, causing compression of the spinal cord with subsequent paralysis. This would require an emergency surgery to decompress, and there are no guarantees that the patient would recover from the paralysis. 4. Discitis-This is an infection of the intervertebral discs.  It occurs in about 1% of discography procedures.  It is difficult to treat and it may lead to surgery.        2. Pain: the needles have to go through skin and soft tissues, will cause soreness.       3. Damage to internal structures:  The nerves to be lesioned may be near blood vessels or     other nerves which can be potentially damaged.       4. Bleeding: Bleeding is more common if the patient is taking blood thinners such as  aspirin, Coumadin, Ticiid, Plavix, etc., or if he/she have some genetic predisposition  such as hemophilia. Bleeding into the spinal canal can cause compression of the spinal  cord with subsequent paralysis.  This would require an emergency surgery to  decompress and there are no guarantees that the patient would recover from the  paralysis.       5. Pneumothorax:  Puncturing of a lung is a possibility, every time a needle is introduced in  the area of the chest or upper back.  Pneumothorax refers to free air around the  collapsed lung(s), inside of the thoracic cavity (chest cavity).  Another two possible  complications related to a similar event would include: Hemothorax and Chylothorax.   These are variations of the Pneumothorax, where instead of air around the collapsed  lung(s), you may have blood or chyle, respectively.       6. Spinal headaches: They may occur with any procedures in the area of the spine.       7. Persistent CSF (Cerebro-Spinal Fluid) leakage: This is a rare problem, but may occur  with prolonged intrathecal or epidural catheters either due to the formation of a fistulous  track or a dural tear.       8. Nerve damage: By working so close to the spinal cord, there is always a possibility of  nerve damage, which could be as serious as a permanent spinal cord injury with  paralysis.       9. Death:  Although rare, severe deadly allergic reactions known as "Anaphylactic  reaction" can occur to any of the medications used.      10. Worsening of the symptoms:  We can always make thing worse.  What are the chances of something like this happening? Chances of any of this occuring are extremely low.  By statistics, you have more of a chance of getting killed in a motor vehicle accident: while driving to the hospital than any of the above occurring .   Nevertheless, you should be aware that they are possibilities.  In general, it is similar to taking a shower.  Everybody knows that you can slip, hit your head and get killed.  Does that mean that you should not shower again?  Nevertheless always keep in mind that statistics do not mean anything if you happen to be on the wrong side of them.  Even if a procedure has a 1 (one) in a 1,000,000 (million) chance of going wrong, it you happen to be that one..Also, keep in mind that by statistics, you have more of a chance of having something go wrong when taking medications.  Who should not have this procedure? If you are on a blood thinning medication (e.g. Coumadin, Plavix, see list of "Blood Thinners"), or if you have an active infection going on, you should not have the procedure.  If you are taking any blood thinners, please inform your physician.  How should I prepare for this procedure?  Do not eat or drink anything at least six hours prior to the procedure.  Bring a driver with you .  It cannot be a taxi.  Come accompanied by an adult that can drive you back, and that is strong enough to help you if your legs get weak or numb from the local anesthetic.  Take all of your medicines the morning of the procedure with just enough water to swallow them.  If you have diabetes, make sure that you are scheduled to have your procedure done first thing in the morning, whenever possible.  If you have diabetes, take only half of your insulin dose and notify our nurse that you have done so as soon as you arrive at the clinic.  If you are diabetic, but only take blood sugar pills (oral hypoglycemic), then do not take them on the morning of your procedure.  You may take them after you have had the procedure.  Do not take aspirin or any aspirin-containing medications, at least eleven (11) days prior to the procedure.  They may prolong bleeding.  Wear loose fitting clothing that may be easy to take off and  that you would not mind if it got stained with Betadine or blood.  Do not wear any jewelry or perfume  Remove any nail coloring.  It will interfere with some of our monitoring equipment.  NOTE: Remember that this is not meant to be interpreted as a complete list of all possible complications.  Unforeseen problems may occur.  BLOOD THINNERS The following drugs contain aspirin or other products, which can cause increased bleeding during surgery and should not be taken for 2 weeks prior to and 1 week after surgery.  If you should need take something for relief of minor pain, you may take acetaminophen which is found in Tylenol,m Datril, Anacin-3 and Panadol. It is not blood thinner. The products listed below are.  Do not take any of the products listed below in addition to any listed on your instruction sheet.  A.P.C or A.P.C with Codeine Codeine Phosphate Capsules #3 Ibuprofen Ridaura  ABC compound Congesprin Imuran rimadil  Advil Cope Indocin Robaxisal  Alka-Seltzer Effervescent Pain Reliever and Antacid Coricidin or Coricidin-D  Indomethacin Rufen  Alka-Seltzer plus Cold Medicine Cosprin Ketoprofen S-A-C Tablets  Anacin Analgesic Tablets or Capsules Coumadin Korlgesic Salflex  Anacin Extra Strength Analgesic tablets or capsules CP-2 Tablets Lanoril Salicylate  Anaprox Cuprimine Capsules Levenox Salocol  Anexsia-D Dalteparin Magan Salsalate  Anodynos Darvon compound Magnesium Salicylate Sine-off  Ansaid Dasin Capsules Magsal Sodium Salicylate  Anturane Depen Capsules Marnal Soma  APF Arthritis pain formula Dewitt's Pills Measurin Stanback  Argesic Dia-Gesic Meclofenamic Sulfinpyrazone  Arthritis Bayer Timed Release Aspirin Diclofenac Meclomen Sulindac  Arthritis pain formula Anacin Dicumarol Medipren Supac  Analgesic (Safety coated) Arthralgen Diffunasal Mefanamic Suprofen  Arthritis Strength Bufferin Dihydrocodeine Mepro Compound Suprol  Arthropan liquid Dopirydamole Methcarbomol with  Aspirin Synalgos  ASA tablets/Enseals Disalcid Micrainin Tagament  Ascriptin Doan's Midol Talwin  Ascriptin A/D Dolene Mobidin Tanderil  Ascriptin Extra Strength Dolobid Moblgesic Ticlid  Ascriptin with Codeine Doloprin or Doloprin with Codeine Momentum Tolectin  Asperbuf Duoprin Mono-gesic Trendar  Aspergum Duradyne Motrin or Motrin IB Triminicin  Aspirin plain, buffered or enteric coated Durasal Myochrisine Trigesic  Aspirin Suppositories Easprin Nalfon Trillsate  Aspirin with Codeine Ecotrin Regular or Extra Strength Naprosyn Uracel  Atromid-S Efficin Naproxen Ursinus  Auranofin Capsules Elmiron Neocylate Vanquish  Axotal Emagrin Norgesic Verin  Azathioprine  Empirin or Empirin with Codeine Normiflo Vitamin E  Azolid Emprazil Nuprin Voltaren  Bayer Aspirin plain, buffered or children's or timed BC Tablets or powders Encaprin Orgaran Warfarin Sodium  Buff-a-Comp Enoxaparin Orudis Zorpin  Buff-a-Comp with Codeine Equegesic Os-Cal-Gesic   Buffaprin Excedrin plain, buffered or Extra Strength Oxalid   Bufferin Arthritis Strength Feldene Oxphenbutazone   Bufferin plain or Extra Strength Feldene Capsules Oxycodone with Aspirin   Bufferin with Codeine Fenoprofen Fenoprofen Pabalate or Pabalate-SF   Buffets II Flogesic Panagesic   Buffinol plain or Extra Strength Florinal or Florinal with Codeine Panwarfarin   Buf-Tabs Flurbiprofen Penicillamine   Butalbital Compound Four-way cold tablets Penicillin   Butazolidin Fragmin Pepto-Bismol   Carbenicillin Geminisyn Percodan   Carna Arthritis Reliever Geopen Persantine   Carprofen Gold's salt Persistin   Chloramphenicol Goody's Phenylbutazone   Chloromycetin Haltrain Piroxlcam   Clmetidine heparin Plaquenil   Cllnoril Hyco-pap Ponstel   Clofibrate Hydroxy chloroquine Propoxyphen         Before stopping any of these medications, be sure to consult the physician who ordered them.  Some, such as Coumadin (Warfarin) are ordered to prevent or  treat serious conditions such as "deep thrombosis", "pumonary embolisms", and other heart problems.  The amount of time that you may need off of the medication may also vary with the medication and the reason for which you were taking it.  If you are taking any of these medications, please make sure you notify your pain physician before you undergo any procedures.  Hydrocodone prescription given

## 2015-08-06 NOTE — Progress Notes (Signed)
Subjective:    Patient ID: Martin Golden, male    DOB: 04/18/1951, 64 y.o.   MRN: [id number]  HPI  PROCEDURE:  Block of nerves to the sacroiliac joint.   NOTE:  The patient is a 64 y.o. male who returns to the [facility] for further evaluation and treatment of pain involving the lower back and lower extremity region with pain in the region of the buttocks as well. Prior MRI studies reveal Degenerative disc disease lumbar spine Lumbar spondylosis with degenerative disc disease causing multilevel central foraminal and subarticular lateral recess stenosis. The patient is with reproduction of severe pain with palpation over the PSIS and PII S region and is with positive Patrick's maneuver as well.   There is concern regarding a significant component of the patient's pain being due to sacroiliac joint dysfunction The risks, benefits, expectations of the procedure have been discussed and explained to the patient who is understanding and willing to proceed with interventional treatment in attempt to decrease severity of patient's symptoms, minimize the risk of medication escalation and  hopefully retard the progression of the patient's symptoms. We will proceed with what is felt to be a medically necessary procedure, block of nerves to the sacroiliac joint.   DESCRIPTION OF PROCEDURE:  Block of nerves to the sacroiliac joint.   The patient was taken to the fluoroscopy suite. With the patient in the prone position with EKG, blood pressure, pulse and pulse oximetry monitoring, IV Versed, IV fentanyl conscious sedation, Betadine prep of proposed entry site was performed.   Block of nerves at the L5 vertebral body level.   With the patient in prone position, under fluoroscopic guidance, a 22 -gauge needle was inserted at the L5 vertebral body level on the left side. With 15 degrees oblique orientation a 22 -gauge needle was inserted in the region known as Burton's eye or eye of the Scotty dog.  Following documentation of needle placement in the area of Burton's eye or eye of the Scotty dog under fluoroscopic guidance, needle placement was then accomplished at the sacral ala level on the left side.   Needle placement at the sacral ala.   With the patient in prone position under fluoroscopic guidance with AP view of the lumbosacral spine, a 22 -gauge needle was inserted in the region known as the sacral ala on the left side. Following documentation of needle placement on the left side under fluoroscopic guidance needle placement was then accomplished at the S1 foramen level.   Needle placement at the S1 foramen level.   With the patient in prone position under fluoroscopic guidance with AP view of the lumbosacral spine and cephalad orientation, a 22 -gauge needle was inserted at the superior and lateral border of the S1 foramen on the left side. Following documentation of needle placement at the S1 foramen level on the left side, needle placement was then accomplished at the S2 foramen level on the left side.   Needle placement at the S2 foramen level.   With the patient in prone position with AP view of the lumbosacral spine with cephalad orientation, a 22 - gauge needle was inserted at the superior and lateral border of the S2 foramen under fluoroscopic guidance on the left side. Following needle placement at the L5 vertebral body level, sacral ala, S1 foramen and S2 foramen on the left   side, needle placement was verified on lateral view under fluoroscopic guidance.  Following needle placement documentation on lateral view, each needle  was injected with 1 mL of 0.25% bupivacaine and Kenalog.   BLOCK OF THE NERVES TO SACROILIAC JOINT ON THE RIGHT SIDE The procedure was performed on the right side at the same levels as was performed on the left side and utilizing the same technique as on the left side and was performed under fluoroscopic guidance as on the left side   A total of 10mg   of Kenalog was utilized for the procedure.   PLAN:  1. Medications: The patient will continue presently prescribed medication hydrocodone acetaminophen 2. The patient will be considered for modification of treatment regimen pending response to the procedure performed on today's visit.  3. The patient is to follow-up with primary care physician Dr. Lisette Grinder III for evaluation of blood pressure and general medical condition following the procedure performed on today's visit.  4. Surgical evaluation as discussed.  5. Neurological evaluation as discussed.  6. The patient may be a candidate for radiofrequency procedures, implantation devices and other treatment pending response to treatment performed on today's visit and follow-up evaluation.  7. The patient has been advised to adhere to proper body mechanics and to avoid activities which may exacerbate the patient's symptoms.   Return appointment to Pain Management Center as scheduled.       Review of Systems     Objective:   Physical Exam        Assessment & Plan:

## 2015-08-06 NOTE — Progress Notes (Signed)
Safety precautions to be maintained throughout the outpatient stay will include: orient to surroundings, keep bed in low position, maintain call bell within reach at all times, provide assistance with transfer out of bed and ambulation.  

## 2015-08-07 ENCOUNTER — Ambulatory Visit: Payer: PRIVATE HEALTH INSURANCE | Admitting: Pain Medicine

## 2015-08-07 ENCOUNTER — Telehealth: Payer: Self-pay | Admitting: *Deleted

## 2015-08-07 NOTE — Telephone Encounter (Signed)
Message left

## 2015-08-14 DIAGNOSIS — F322 Major depressive disorder, single episode, severe without psychotic features: Secondary | ICD-10-CM | POA: Diagnosis not present

## 2015-09-01 DIAGNOSIS — R05 Cough: Secondary | ICD-10-CM | POA: Diagnosis not present

## 2015-09-01 DIAGNOSIS — J019 Acute sinusitis, unspecified: Secondary | ICD-10-CM | POA: Diagnosis not present

## 2015-09-02 ENCOUNTER — Encounter: Payer: Self-pay | Admitting: Pain Medicine

## 2015-09-02 ENCOUNTER — Ambulatory Visit: Payer: Medicare Other | Attending: Pain Medicine | Admitting: Pain Medicine

## 2015-09-02 VITALS — BP 117/69 | HR 96 | Temp 95.6°F | Resp 18 | Ht 73.0 in | Wt 260.0 lb

## 2015-09-02 DIAGNOSIS — M4806 Spinal stenosis, lumbar region: Secondary | ICD-10-CM | POA: Diagnosis not present

## 2015-09-02 DIAGNOSIS — M533 Sacrococcygeal disorders, not elsewhere classified: Secondary | ICD-10-CM

## 2015-09-02 DIAGNOSIS — C61 Malignant neoplasm of prostate: Secondary | ICD-10-CM | POA: Diagnosis not present

## 2015-09-02 DIAGNOSIS — M545 Low back pain: Secondary | ICD-10-CM | POA: Diagnosis present

## 2015-09-02 DIAGNOSIS — M5116 Intervertebral disc disorders with radiculopathy, lumbar region: Secondary | ICD-10-CM | POA: Insufficient documentation

## 2015-09-02 DIAGNOSIS — M5030 Other cervical disc degeneration, unspecified cervical region: Secondary | ICD-10-CM

## 2015-09-02 DIAGNOSIS — M47816 Spondylosis without myelopathy or radiculopathy, lumbar region: Secondary | ICD-10-CM

## 2015-09-02 DIAGNOSIS — M47812 Spondylosis without myelopathy or radiculopathy, cervical region: Secondary | ICD-10-CM

## 2015-09-02 DIAGNOSIS — M48062 Spinal stenosis, lumbar region with neurogenic claudication: Secondary | ICD-10-CM

## 2015-09-02 DIAGNOSIS — M5136 Other intervertebral disc degeneration, lumbar region: Secondary | ICD-10-CM

## 2015-09-02 DIAGNOSIS — M48061 Spinal stenosis, lumbar region without neurogenic claudication: Secondary | ICD-10-CM

## 2015-09-02 DIAGNOSIS — M5416 Radiculopathy, lumbar region: Secondary | ICD-10-CM

## 2015-09-02 DIAGNOSIS — M47896 Other spondylosis, lumbar region: Secondary | ICD-10-CM | POA: Insufficient documentation

## 2015-09-02 DIAGNOSIS — M47817 Spondylosis without myelopathy or radiculopathy, lumbosacral region: Secondary | ICD-10-CM | POA: Diagnosis not present

## 2015-09-02 DIAGNOSIS — M791 Myalgia: Secondary | ICD-10-CM | POA: Diagnosis not present

## 2015-09-02 DIAGNOSIS — Z9889 Other specified postprocedural states: Secondary | ICD-10-CM

## 2015-09-02 DIAGNOSIS — M51369 Other intervertebral disc degeneration, lumbar region without mention of lumbar back pain or lower extremity pain: Secondary | ICD-10-CM

## 2015-09-02 MED ORDER — HYDROCODONE-ACETAMINOPHEN 10-325 MG PO TABS: ORAL_TABLET | ORAL | Status: DC

## 2015-09-02 NOTE — Progress Notes (Signed)
Safety precautions to be maintained throughout the outpatient stay will include: orient to surroundings, keep bed in low position, maintain call bell within reach at all times, provide assistance with transfer out of bed and ambulation.  

## 2015-09-02 NOTE — Progress Notes (Signed)
   Subjective:    Patient ID: Martin Golden, male    DOB: [birth year], 64 y.o.   MRN: [id number]  HPI  The patient is a 64 year old gentleman who returns to pain management for further evaluation and treatment of pain involving the lower back and lower extremity region. The patient states that he continues his hydrocodone acetaminophen. The patient states that he has significantly of pain following block of nerves to the sacroiliac joint. We discussed patient's condition on today's visit and we'll proceed with block of nerves to the sacroiliac joint at time return appointment in attempt to decrease severity of symptoms, minimize progression of symptoms, and avoid the need for more involved treatment. The patient agreed to suggested treatment plan. Patient denies trauma change in events of daily living the call significant change in symptoms. Desire to consider surgical intervention and states that he has had increased ability to stand or walk for this turn since undergoing interventional treatment pain management Center. We will also request approval for radiolucent rhizolysis lumbar facet, medial branch nerves to be performed in attempt to increase the duration of relief of pain following interventional treatment. The patient was with understanding and agreement suggested treatment plan.      Review of Systems     Objective:   Physical Exam  There was tends to palpation of the splenius capitis and occipitalis musculature region a mild degree with mild tenderness over the cervical facet cervical paraspinal musculature region. Patient appeared to be with bilaterally equal grip strength and Tinel and Phalen's maneuver were without increased pain of significant degree. There was tends to palpation over the region of the thoracic facet thoracic paraspinal musculature region with no crepitus of the thoracic region noted. Palpation over the lumbar paraspinal must reason lumbar facet region was with  moderate tends to palpation with lateral bending rotation extension and palpation of the lumbar facets reproducing moderate discomfort. Palpation over the PSIS and PII S region reproduced severely disabling pain with lateral bending rotation extension and palpation of the lumbar facets reproducing moderately severe discomfort. Palpation over the region of the greater trochanteric region iliotibial band region was with minimal increased pain with straight leg raising tolerates approximately 30 without a definite increased pain with dorsiflexion noted. There appeared to be negative clonus negative Homans. DTRs were difficult to this patient had difficulty relaxing. No definite sensory deficit or dermatomal distribution detected. Abdomen nontender with no costovertebral angle tenderness noted.      Assessment & Plan:    Degenerative disc disease lumbar spine Lumbar spondylosis with degenerative disc disease causing multilevel central foraminal and subarticular lateral recess stenosis  Lumbar facet syndrome  Lumbar radiculopathy  Lumbar stenosis  History of right renal cyst with suspected left renal atrophy  Carcinoma of prostate      PLAN   Continue present medication: Hydrocodone acetaminophen  Block of nerves to the sacroiliac joint to be performed at time return appointment  F/U PCP Dr. Lisette Grinder III for evaliation of  BP and general medical  condition  F/U surgical evaluation. May consider pending follow-up evaluations  F/U neurological evaluation. May consider pending follow-up evaluations  May consider radiofrequency rhizolysis or intraspinal procedures pending response to present treatment and F/U evaluation   Patient to call Pain Management Center should patient have concerns prior to scheduled return appointment.

## 2015-09-02 NOTE — Patient Instructions (Addendum)
PLAN   Continue present medication: Hydrocodone acetaminophen  Locke of nerves to the sacroiliac joint to be performed at time return appointment  F/U PCP Dr. Lisette Grinder III for evaliation of  BP and general medical  condition  F/U surgical evaluation. May consider pending follow-up evaluations  F/U neurological evaluation. May consider pending follow-up evaluations  May consider radiofrequency rhizolysis or intraspinal procedures pending response to present treatment and F/U evaluation   Patient to call [facility] should patient have concerns prior to scheduled return appointment.GENERAL RISKS AND COMPLICATIONS  What are the risk, side effects and possible complications? Generally speaking, most procedures are safe.  However, with any procedure there are risks, side effects, and the possibility of complications.  The risks and complications are dependent upon the sites that are lesioned, or the type of nerve block to be performed.  The closer the procedure is to the spine, the more serious the risks are.  Great care is taken when placing the radio frequency needles, block needles or lesioning probes, but sometimes complications can occur. 1. Infection: Any time there is an injection through the skin, there is a risk of infection.  This is why sterile conditions are used for these blocks.  There are four possible types of infection. 1. Localized skin infection. 2. Central Nervous System Infection-This can be in the form of Meningitis, which can be deadly. 3. Epidural Infections-This can be in the form of an epidural abscess, which can cause pressure inside of the spine, causing compression of the spinal cord with subsequent paralysis. This would require an emergency surgery to decompress, and there are no guarantees that the patient would recover from the paralysis. 4. Discitis-This is an infection of the intervertebral discs.  It occurs in about 1% of discography procedures.  It  is difficult to treat and it may lead to surgery.        2. Pain: the needles have to go through skin and soft tissues, will cause soreness.       3. Damage to internal structures:  The nerves to be lesioned may be near blood vessels or    other nerves which can be potentially damaged.       4. Bleeding: Bleeding is more common if the patient is taking blood thinners such as  aspirin, Coumadin, Ticiid, Plavix, etc., or if he/she have some genetic predisposition  such as hemophilia. Bleeding into the spinal canal can cause compression of the spinal  cord with subsequent paralysis.  This would require an emergency surgery to  decompress and there are no guarantees that the patient would recover from the  paralysis.       5. Pneumothorax:  Puncturing of a lung is a possibility, every time a needle is introduced in  the area of the chest or upper back.  Pneumothorax refers to free air around the  collapsed lung(s), inside of the thoracic cavity (chest cavity).  Another two possible  complications related to a similar event would include: Hemothorax and Chylothorax.   These are variations of the Pneumothorax, where instead of air around the collapsed  lung(s), you may have blood or chyle, respectively.       6. Spinal headaches: They may occur with any procedures in the area of the spine.       7. Persistent CSF (Cerebro-Spinal Fluid) leakage: This is a rare problem, but may occur  with prolonged intrathecal or epidural catheters either due to the formation of a fistulous  track or a dural tear.       8. Nerve damage: By working so close to the spinal cord, there is always a possibility of  nerve damage, which could be as serious as a permanent spinal cord injury with  paralysis.       9. Death:  Although rare, severe deadly allergic reactions known as "Anaphylactic  reaction" can occur to any of the medications used.      10. Worsening of the symptoms:  We can always make thing worse.  What are the chances  of something like this happening? Chances of any of this occuring are extremely low.  By statistics, you have more of a chance of getting killed in a motor vehicle accident: while driving to the hospital than any of the above occurring .  Nevertheless, you should be aware that they are possibilities.  In general, it is similar to taking a shower.  Everybody knows that you can slip, hit your head and get killed.  Does that mean that you should not shower again?  Nevertheless always keep in mind that statistics do not mean anything if you happen to be on the wrong side of them.  Even if a procedure has a 1 (one) in a 1,000,000 (million) chance of going wrong, it you happen to be that one..Also, keep in mind that by statistics, you have more of a chance of having something go wrong when taking medications.  Who should not have this procedure? If you are on a blood thinning medication (e.g. Coumadin, Plavix, see list of "Blood Thinners"), or if you have an active infection going on, you should not have the procedure.  If you are taking any blood thinners, please inform your physician.  How should I prepare for this procedure?  Do not eat or drink anything at least six hours prior to the procedure.  Bring a driver with you .  It cannot be a taxi.  Come accompanied by an adult that can drive you back, and that is strong enough to help you if your legs get weak or numb from the local anesthetic.  Take all of your medicines the morning of the procedure with just enough water to swallow them.  If you have diabetes, make sure that you are scheduled to have your procedure done first thing in the morning, whenever possible.  If you have diabetes, take only half of your insulin dose and notify our nurse that you have done so as soon as you arrive at the clinic.  If you are diabetic, but only take blood sugar pills (oral hypoglycemic), then do not take them on the morning of your procedure.  You may take them  after you have had the procedure.  Do not take aspirin or any aspirin-containing medications, at least eleven (11) days prior to the procedure.  They may prolong bleeding.  Wear loose fitting clothing that may be easy to take off and that you would not mind if it got stained with Betadine or blood.  Do not wear any jewelry or perfume  Remove any nail coloring.  It will interfere with some of our monitoring equipment.  NOTE: Remember that this is not meant to be interpreted as a complete list of all possible complications.  Unforeseen problems may occur.  BLOOD THINNERS The following drugs contain aspirin or other products, which can cause increased bleeding during surgery and should not be taken for 2 weeks prior to and 1 week after surgery.  If you should need take something for relief of minor pain, you may take acetaminophen which is found in Tylenol,m Datril, Anacin-3 and Panadol. It is not blood thinner. The products listed below are.  Do not take any of the products listed below in addition to any listed on your instruction sheet.  A.P.C or A.P.C with Codeine Codeine Phosphate Capsules #3 Ibuprofen Ridaura  ABC compound Congesprin Imuran rimadil  Advil Cope Indocin Robaxisal  Alka-Seltzer Effervescent Pain Reliever and Antacid Coricidin or Coricidin-D  Indomethacin Rufen  Alka-Seltzer plus Cold Medicine Cosprin Ketoprofen S-A-C Tablets  Anacin Analgesic Tablets or Capsules Coumadin Korlgesic Salflex  Anacin Extra Strength Analgesic tablets or capsules CP-2 Tablets Lanoril Salicylate  Anaprox Cuprimine Capsules Levenox Salocol  Anexsia-D Dalteparin Magan Salsalate  Anodynos Darvon compound Magnesium Salicylate Sine-off  Ansaid Dasin Capsules Magsal Sodium Salicylate  Anturane Depen Capsules Marnal Soma  APF Arthritis pain formula Dewitt's Pills Measurin Stanback  Argesic Dia-Gesic Meclofenamic Sulfinpyrazone  Arthritis Bayer Timed Release Aspirin Diclofenac Meclomen Sulindac   Arthritis pain formula Anacin Dicumarol Medipren Supac  Analgesic (Safety coated) Arthralgen Diffunasal Mefanamic Suprofen  Arthritis Strength Bufferin Dihydrocodeine Mepro Compound Suprol  Arthropan liquid Dopirydamole Methcarbomol with Aspirin Synalgos  ASA tablets/Enseals Disalcid Micrainin Tagament  Ascriptin Doan's Midol Talwin  Ascriptin A/D Dolene Mobidin Tanderil  Ascriptin Extra Strength Dolobid Moblgesic Ticlid  Ascriptin with Codeine Doloprin or Doloprin with Codeine Momentum Tolectin  Asperbuf Duoprin Mono-gesic Trendar  Aspergum Duradyne Motrin or Motrin IB Triminicin  Aspirin plain, buffered or enteric coated Durasal Myochrisine Trigesic  Aspirin Suppositories Easprin Nalfon Trillsate  Aspirin with Codeine Ecotrin Regular or Extra Strength Naprosyn Uracel  Atromid-S Efficin Naproxen Ursinus  Auranofin Capsules Elmiron Neocylate Vanquish  Axotal Emagrin Norgesic Verin  Azathioprine Empirin or Empirin with Codeine Normiflo Vitamin E  Azolid Emprazil Nuprin Voltaren  Bayer Aspirin plain, buffered or children's or timed BC Tablets or powders Encaprin Orgaran Warfarin Sodium  Buff-a-Comp Enoxaparin Orudis Zorpin  Buff-a-Comp with Codeine Equegesic Os-Cal-Gesic   Buffaprin Excedrin plain, buffered or Extra Strength Oxalid   Bufferin Arthritis Strength Feldene Oxphenbutazone   Bufferin plain or Extra Strength Feldene Capsules Oxycodone with Aspirin   Bufferin with Codeine Fenoprofen Fenoprofen Pabalate or Pabalate-SF   Buffets II Flogesic Panagesic   Buffinol plain or Extra Strength Florinal or Florinal with Codeine Panwarfarin   Buf-Tabs Flurbiprofen Penicillamine   Butalbital Compound Four-way cold tablets Penicillin   Butazolidin Fragmin Pepto-Bismol   Carbenicillin Geminisyn Percodan   Carna Arthritis Reliever Geopen Persantine   Carprofen Gold's salt Persistin   Chloramphenicol Goody's Phenylbutazone   Chloromycetin Haltrain Piroxlcam   Clmetidine heparin Plaquenil    Cllnoril Hyco-pap Ponstel   Clofibrate Hydroxy chloroquine Propoxyphen         Before stopping any of these medications, be sure to consult the physician who ordered them.  Some, such as Coumadin (Warfarin) are ordered to prevent or treat serious conditions such as "deep thrombosis", "pumonary embolisms", and other heart problems.  The amount of time that you may need off of the medication may also vary with the medication and the reason for which you were taking it.  If you are taking any of these medications, please make sure you notify your pain physician before you undergo any procedures.         Sacroiliac (SI) Joint Injection Patient Information  Description: The sacroiliac joint connects the scrum (very low back and tailbone) to the ilium (a pelvic bone which also forms half of the hip joint).  Normally this joint experiences very little motion.  When this joint becomes inflamed or unstable low back and or hip and pelvis pain may result.  Injection of this joint with local anesthetics (numbing medicines) and steroids can provide diagnostic information and reduce pain.  This injection is performed with the aid of x-ray guidance into the tailbone area while you are lying on your stomach.   You may experience an electrical sensation down the leg while this is being done.  You may also experience numbness.  We also may ask if we are reproducing your normal pain during the injection.  Conditions which may be treated SI injection:   Low back, buttock, hip or leg pain  Preparation for the Injection:  1. Do not eat any solid food or dairy products within 6 hours of your appointment.  2. You may drink clear liquids up to 2 hours before appointment.  Clear liquids include water, black coffee, juice or soda.  No milk or cream please. 3. You may take your regular medications, including pain medications with a sip of water before your appointment.  Diabetics should hold regular insulin (if  take separately) and take 1/2 normal NPH dose the morning of the procedure.  Carry some sugar containing items with you to your appointment. 4. A driver must accompany you and be prepared to drive you home after your procedure. 5. Bring all of your current medications with you. 6. An IV may be inserted and sedation may be given at the discretion of the physician. 7. A blood pressure cuff, EKG and other monitors will often be applied during the procedure.  Some patients may need to have extra oxygen administered for a short period.  8. You will be asked to provide medical information, including your allergies, prior to the procedure.  We must know immediately if you are taking blood thinners (like Coumadin/Warfarin) or if you are allergic to IV iodine contrast (dye).  We must know if you could possible be pregnant.  Possible side effects:   Bleeding from needle site  Infection (rare, may require surgery)  Nerve injury (rare)  Numbness & tingling (temporary)  A brief convulsion or seizure  Light-headedness (temporary)  Pain at injection site (several days)  Decreased blood pressure (temporary)  Weakness in the leg (temporary)   Call if you experience:   New onset weakness or numbness of an extremity below the injection site that last more than 8 hours.  Hives or difficulty breathing ( go to the emergency room)  Inflammation or drainage at the injection site  Any new symptoms which are concerning to you  Please note:  Although the local anesthetic injected can often make your back/ hip/ buttock/ leg feel good for several hours after the injections, the pain will likely return.  It takes 3-7 days for steroids to work in the sacroiliac area.  You may not notice any pain relief for at least that one week.  If effective, we will often do a series of three injections spaced 3-6 weeks apart to maximally decrease your pain.  After the initial series, we generally will wait some  months before a repeat injection of the same type.  If you have any questions, please call [phone] [facility]

## 2015-09-03 DIAGNOSIS — F322 Major depressive disorder, single episode, severe without psychotic features: Secondary | ICD-10-CM | POA: Diagnosis not present

## 2015-09-10 ENCOUNTER — Ambulatory Visit: Payer: PRIVATE HEALTH INSURANCE | Admitting: Pain Medicine

## 2015-09-22 ENCOUNTER — Encounter: Payer: Self-pay | Admitting: Pain Medicine

## 2015-09-22 ENCOUNTER — Ambulatory Visit: Payer: Medicare Other | Attending: Pain Medicine | Admitting: Pain Medicine

## 2015-09-22 VITALS — BP 138/83 | HR 84 | Temp 98.4°F | Resp 16 | Wt 270.0 lb

## 2015-09-22 DIAGNOSIS — M47812 Spondylosis without myelopathy or radiculopathy, cervical region: Secondary | ICD-10-CM

## 2015-09-22 DIAGNOSIS — M48061 Spinal stenosis, lumbar region without neurogenic claudication: Secondary | ICD-10-CM

## 2015-09-22 DIAGNOSIS — M5136 Other intervertebral disc degeneration, lumbar region: Secondary | ICD-10-CM

## 2015-09-22 DIAGNOSIS — M48062 Spinal stenosis, lumbar region with neurogenic claudication: Secondary | ICD-10-CM

## 2015-09-22 DIAGNOSIS — M533 Sacrococcygeal disorders, not elsewhere classified: Secondary | ICD-10-CM

## 2015-09-22 DIAGNOSIS — Z9889 Other specified postprocedural states: Secondary | ICD-10-CM

## 2015-09-22 DIAGNOSIS — M79606 Pain in leg, unspecified: Secondary | ICD-10-CM | POA: Diagnosis present

## 2015-09-22 DIAGNOSIS — M47817 Spondylosis without myelopathy or radiculopathy, lumbosacral region: Secondary | ICD-10-CM | POA: Diagnosis not present

## 2015-09-22 DIAGNOSIS — M4806 Spinal stenosis, lumbar region: Secondary | ICD-10-CM | POA: Insufficient documentation

## 2015-09-22 DIAGNOSIS — M47816 Spondylosis without myelopathy or radiculopathy, lumbar region: Secondary | ICD-10-CM | POA: Diagnosis not present

## 2015-09-22 DIAGNOSIS — M5030 Other cervical disc degeneration, unspecified cervical region: Secondary | ICD-10-CM

## 2015-09-22 DIAGNOSIS — M545 Low back pain: Secondary | ICD-10-CM | POA: Diagnosis not present

## 2015-09-22 DIAGNOSIS — M5416 Radiculopathy, lumbar region: Secondary | ICD-10-CM

## 2015-09-22 MED ORDER — FENTANYL CITRATE (PF) 100 MCG/2ML IJ SOLN
INTRAMUSCULAR | Status: AC
  Administered 2015-09-22: 100 ug via INTRAVENOUS
  Filled 2015-09-22: qty 2

## 2015-09-22 MED ORDER — ORPHENADRINE CITRATE 30 MG/ML IJ SOLN
60.0000 mg | Freq: Once | INTRAMUSCULAR | Status: AC
  Administered 2015-09-22: 60 mg via INTRAMUSCULAR

## 2015-09-22 MED ORDER — CIPROFLOXACIN IN D5W 400 MG/200ML IV SOLN
400.0000 mg | Freq: Once | INTRAVENOUS | Status: AC
  Administered 2015-09-22: 400 mg via INTRAVENOUS

## 2015-09-22 MED ORDER — MIDAZOLAM HCL 5 MG/5ML IJ SOLN
INTRAMUSCULAR | Status: AC
  Administered 2015-09-22: 4 mg via INTRAVENOUS
  Filled 2015-09-22: qty 5

## 2015-09-22 MED ORDER — CIPROFLOXACIN HCL 250 MG PO TABS: 250.0000 mg | ORAL_TABLET | Freq: Two times a day (BID) | ORAL | Status: DC

## 2015-09-22 MED ORDER — MIDAZOLAM HCL 5 MG/5ML IJ SOLN
5.0000 mg | Freq: Once | INTRAMUSCULAR | Status: AC
  Administered 2015-09-22: 4 mg via INTRAVENOUS

## 2015-09-22 MED ORDER — FENTANYL CITRATE (PF) 100 MCG/2ML IJ SOLN
100.0000 ug | Freq: Once | INTRAMUSCULAR | Status: AC
  Administered 2015-09-22: 100 ug via INTRAVENOUS

## 2015-09-22 MED ORDER — BUPIVACAINE HCL (PF) 0.25 % IJ SOLN
INTRAMUSCULAR | Status: AC
  Administered 2015-09-22: 30 mL
  Filled 2015-09-22: qty 30

## 2015-09-22 MED ORDER — TRIAMCINOLONE ACETONIDE 40 MG/ML IJ SUSP
INTRAMUSCULAR | Status: AC
Start: 2015-09-22 — End: 2015-09-22
  Administered 2015-09-22: 40 mg
  Filled 2015-09-22: qty 1

## 2015-09-22 MED ORDER — BUPIVACAINE HCL (PF) 0.25 % IJ SOLN
30.0000 mL | Freq: Once | INTRAMUSCULAR | Status: AC
  Administered 2015-09-22: 30 mL

## 2015-09-22 MED ORDER — ORPHENADRINE CITRATE 30 MG/ML IJ SOLN
INTRAMUSCULAR | Status: AC
  Administered 2015-09-22: 60 mg via INTRAMUSCULAR
  Filled 2015-09-22: qty 2

## 2015-09-22 MED ORDER — TRIAMCINOLONE ACETONIDE 40 MG/ML IJ SUSP
40.0000 mg | Freq: Once | INTRAMUSCULAR | Status: AC
  Administered 2015-09-22: 40 mg

## 2015-09-22 MED ORDER — LACTATED RINGERS IV SOLN: 1000.0000 mL | INTRAVENOUS | Status: DC

## 2015-09-22 MED ORDER — CIPROFLOXACIN IN D5W 400 MG/200ML IV SOLN
INTRAVENOUS | Status: AC
  Administered 2015-09-22: 400 mg via INTRAVENOUS
  Filled 2015-09-22: qty 200

## 2015-09-22 NOTE — Progress Notes (Signed)
Safety precautions to be maintained throughout the outpatient stay will include: orient to surroundings, keep bed in low position, maintain call bell within reach at all times, provide assistance with transfer out of bed and ambulation.  

## 2015-09-22 NOTE — Progress Notes (Signed)
Subjective:    Patient ID: Martin Golden, male    DOB: 1951/07/07, 65 y.o.   MRN: [id number]  HPI  PROCEDURE:  Block of nerves to the sacroiliac joint.   NOTE:  The patient is a 65 y.o. male who returns to the [facility] for further evaluation and treatment of pain involving the lower back and lower extremity region with pain in the region of the buttocks as well. Prior MRI studies reveal  Degenerative disc disease lumbar spine Lumbar spondylosis with degenerative disc disease causing multilevel central foraminal and subarticular lateral recess stenosis. The patient is a severe tenderness to palpation over the PSIS and PII S regions. The patient is with prior surgery of the hips as well. There is concern regarding significant component of patient's pain being due to sacroiliac joint dysfunction.   There is concern regarding a significant component of the patient's pain being due to sacroiliac joint dysfunction The risks, benefits, expectations of the procedure have been discussed and explained to the patient who is understanding and willing to proceed with interventional treatment in attempt to decrease severity of patient's symptoms, minimize the risk of medication escalation and  hopefully retard the progression of the patient's symptoms. We will proceed with what is felt to be a medically necessary procedure, block of nerves to the sacroiliac joint.   DESCRIPTION OF PROCEDURE:  Block of nerves to the sacroiliac joint.   The patient was taken to the fluoroscopy suite. With the patient in the prone position with EKG, blood pressure, pulse and pulse oximetry monitoring, IV Versed, IV fentanyl conscious sedation, Betadine prep of proposed entry site was performed.   Block of nerves at the L5 vertebral body level.   With the patient in prone position, under fluoroscopic guidance, a 22 -gauge needle was inserted at the L5 vertebral body level on the left left side. With 15 degrees  oblique orientation a 22 -gauge needle was inserted in the region known as Burton's eye or eye of the Scotty dog. Following documentation of needle placement in the area of Burton's eye or eye of the Scotty dog under fluoroscopic guidance, needle placement was then accomplished at the sacral ala level on the left side.   Needle placement at the sacral ala.   With the patient in prone position under fluoroscopic guidance with AP view of the lumbosacral spine, a 22 -gauge needle was inserted in the region known as the sacral ala on the left side. Following documentation of needle placement on the left side under fluoroscopic guidance needle placement was then accomplished at the S1 foramen level.   Needle placement at the S1 foramen level.   With the patient in prone position under fluoroscopic guidance with AP view of the lumbosacral spine and cephalad orientation, a 22 -gauge needle was inserted at the superior and lateral border of the S1 foramen on the left side. Following documentation of needle placement at the S1 foramen level on the left side, needle placement was then accomplished at the S2 foramen level on the left side.   Needle placement at the S2 foramen level.   With the patient in prone position with AP view of the lumbosacral spine with cephalad orientation, a 22 - gauge needle was inserted at the superior and lateral border of the S2 foramen under fluoroscopic guidance on the left side. Following needle placement at the L5 vertebral body level, sacral ala, S1 foramen and S2 foramen on the left side, needle placement was verified  on lateral view under fluoroscopic guidance.  Following needle placement documentation on lateral view, each needle was injected with 1 mL of 0.25% bupivacaine and Kenalog.   BLOCK OF THE NERVES TO SACROILIAC JOINT ON THE RIGHT SIDE The procedure was performed on the right side at the same levels as was performed on the left side and utilizing the same  technique as on the left side and was performed under fluoroscopic guidance as on the left side   A total of 10mg  of Kenalog was utilized for the procedure.   PLAN:  1. Medications: The patient will continue presently prescribed medication hydrocodone acetaminophen  2. The patient will be considered for modification of treatment regimen pending response to the procedure performed on today's visit.  3. The patient is to follow-up with primary care physician Dr. Lisette Grinder III for evaluation of blood pressure and general medical condition following the procedure performed on today's visit.  4. Surgical evaluation as discussed. Has been addressed 5. Neurological evaluation as discussed. Has been addressed 6. The patient may be a candidate for radiofrequency procedures, implantation devices and other treatment pending response to treatment performed on today's visit and follow-up evaluation.  7. The patient has been advised to adhere to proper body mechanics and to avoid activities which may exacerbate the patient's symptoms.   Return appointment to [facility] as scheduled.    Review of Systems     Objective:   Physical Exam        Assessment & Plan:

## 2015-09-22 NOTE — Patient Instructions (Addendum)
PLAN  Continue present medication hydrocodone acetaminophen and begin taking antibiotic Cipro as prescribed. Please obtain your antibiotic Cipro today and begin taking Cipro today  F/U PCP Dr. Lisette Grinder III for  evaliation of  BP and general medical  condition.  F/U surgical evaluation. May consider pending follow-up evaluations  F/U neurological evaluation. May consider pending follow-up evaluations  May consider radiofrequency rhizolysis or intraspinal procedures pending response to present treatment and F/U evaluation  Patient to call Pain Management Center should patient have concerns prior to scheduled return appointment.Selective Nerve Root Block Patient Information  Description: Specific nerve roots exit the spinal canal and these nerves can be compressed and inflamed by a bulging disc and bone spurs.  By injecting steroids on the nerve root, we can potentially decrease the inflammation surrounding these nerves, which often leads to decreased pain.  Also, by injecting local anesthesia on the nerve root, this can provide [place] helpful information to give to your referring doctor if it decreases your pain.  Selective nerve root blocks can be done along the spine from the neck to the low back depending on the location of your pain.   After numbing the skin with local anesthesia, a small needle is passed to the nerve root and the position of the needle is verified using x-ray pictures.  After the needle is in correct position, we then deposit the medication.  You may experience a pressure sensation while this is being done.  The entire block usually lasts less than 15 minutes.  Conditions that may be treated with selective nerve root blocks:  Low back and leg pain  Spinal stenosis  Diagnostic block prior to potential surgery  Neck and arm pain  Post laminectomy syndrome  Preparation for the injection:  1. Do not eat any solid food or dairy products within 6 hours of your  appointment. 2. You may drink clear liquids up to 2 hours before an appointment.  Clear liquids include water, black coffee, juice or soda.  No milk or cream please. 3. You may take your regular medications, including pain medications, with a sip of water before your appointment.  Diabetics should hold regular insulin (if taken separately) and take 1/2 normal NPH dose the morning of the procedure.  Carry some sugar containing items with you to your appointment. 4. A driver must accompany you and be prepared to drive you home after your procedure. 5. Bring all your current medications with you. 6. An IV may be inserted and sedation may be given at the discretion of the physician. 7. A blood pressure cuff, EKG, and other monitors will often be applied during the procedure.  Some patients may need to have extra oxygen administered for a short period. 8. You will be asked to provide medical information, including allergies, prior to the procedure.  We must know immediately if you are taking blood  Thinners (like Coumadin) or if you are allergic to IV iodine contrast (dye).  Possible side-effects: All are usually temporary  Bleeding from needle site  Light headedness  Numbness and tingling  Decreased blood pressure  Weakness in arms/legs  Pressure sensation in back/neck  Pain at injection site (several days)  Possible complications: All are extremely rare  Infection  Nerve injury  Spinal headache (a headache wore with upright position)  Call if you experience:  Fever/chills associated with headache or increased back/neck pain  Headache worsened by an upright position  New onset weakness or numbness of an extremity below the  injection site  Hives or difficulty breathing (go to the emergency room)  Inflammation or drainage at the injection site(s)  Severe back/neck pain greater than usual  New symptoms which are concerning to you  Please note:  Although the local  anesthetic injected can often make your back or neck feel good for several hours after the injection the pain will likely return.  It takes 3-5 days for steroids to work on the nerve root. You may not notice any pain relief for at least one week.  If effective, we will often do a series of 3 injections spaced 3-6 weeks apart to maximally decrease your pain.    If you have any questions, please call [phone] [facility] Inst[facility]  General Discharge Instructions :  If you need to reach your doctor call: Monday-Friday 8:00 am - 4:00 pm at [phone] or toll free [phone].  After clinic hours [phone] to have operator reach doctor.  Bring all of your medication bottles to all your appointments in the pain clinic.  To cancel or reschedule your appointment with Pain Management please remember to call 24 hours in advance to avoid a fee.  Refer to the educational materials which you have been given on: General Risks, I had my Procedure. Discharge Instructions, Post Sedation.  Post Procedure Instructions:  The drugs you were given will stay in your system until tomorrow, so for the next 24 hours you should not drive, make any legal decisions or drink any alcoholic beverages.  You may eat anything you prefer, but it is better to start with liquids then soups and crackers, and gradually work up to solid foods.  Please notify your doctor immediately if you have any unusual bleeding, trouble breathing or pain that is not related to your normal pain.  Depending on the type of procedure that was done, some parts of your body may feel week and/or numb.  This usually clears up by tonight or the next day.  Walk with the use of an assistive device or accompanied by an adult for the 24 hours.  You may use ice on the affected area for the first 24 hours.  Put ice in a Ziploc bag and cover with a towel and place against area 15  minutes on 15 minutes off.  You may switch to heat after 24 hours.

## 2015-09-23 ENCOUNTER — Telehealth: Payer: Self-pay | Admitting: *Deleted

## 2015-09-23 NOTE — Telephone Encounter (Signed)
Left voice mail

## 2015-10-02 DIAGNOSIS — R635 Abnormal weight gain: Secondary | ICD-10-CM | POA: Diagnosis not present

## 2015-10-02 DIAGNOSIS — I10 Essential (primary) hypertension: Secondary | ICD-10-CM | POA: Diagnosis not present

## 2015-10-02 DIAGNOSIS — Z6838 Body mass index (BMI) 38.0-38.9, adult: Secondary | ICD-10-CM | POA: Diagnosis not present

## 2015-10-07 ENCOUNTER — Encounter: Payer: Self-pay | Admitting: Pain Medicine

## 2015-10-07 ENCOUNTER — Ambulatory Visit: Payer: Medicare Other | Attending: Pain Medicine | Admitting: Pain Medicine

## 2015-10-07 VITALS — BP 129/86 | HR 80 | Temp 97.5°F | Resp 18 | Ht 73.0 in | Wt 277.0 lb

## 2015-10-07 DIAGNOSIS — M545 Low back pain: Secondary | ICD-10-CM | POA: Diagnosis present

## 2015-10-07 DIAGNOSIS — M48062 Spinal stenosis, lumbar region with neurogenic claudication: Secondary | ICD-10-CM

## 2015-10-07 DIAGNOSIS — M5116 Intervertebral disc disorders with radiculopathy, lumbar region: Secondary | ICD-10-CM | POA: Insufficient documentation

## 2015-10-07 DIAGNOSIS — N281 Cyst of kidney, acquired: Secondary | ICD-10-CM | POA: Insufficient documentation

## 2015-10-07 DIAGNOSIS — M533 Sacrococcygeal disorders, not elsewhere classified: Secondary | ICD-10-CM

## 2015-10-07 DIAGNOSIS — M5416 Radiculopathy, lumbar region: Secondary | ICD-10-CM

## 2015-10-07 DIAGNOSIS — M79605 Pain in left leg: Secondary | ICD-10-CM | POA: Diagnosis present

## 2015-10-07 DIAGNOSIS — Z9889 Other specified postprocedural states: Secondary | ICD-10-CM

## 2015-10-07 DIAGNOSIS — M51369 Other intervertebral disc degeneration, lumbar region without mention of lumbar back pain or lower extremity pain: Secondary | ICD-10-CM

## 2015-10-07 DIAGNOSIS — M47812 Spondylosis without myelopathy or radiculopathy, cervical region: Secondary | ICD-10-CM

## 2015-10-07 DIAGNOSIS — M4806 Spinal stenosis, lumbar region: Secondary | ICD-10-CM | POA: Insufficient documentation

## 2015-10-07 DIAGNOSIS — C61 Malignant neoplasm of prostate: Secondary | ICD-10-CM | POA: Insufficient documentation

## 2015-10-07 DIAGNOSIS — M48061 Spinal stenosis, lumbar region without neurogenic claudication: Secondary | ICD-10-CM

## 2015-10-07 DIAGNOSIS — M47817 Spondylosis without myelopathy or radiculopathy, lumbosacral region: Secondary | ICD-10-CM | POA: Diagnosis not present

## 2015-10-07 DIAGNOSIS — M5030 Other cervical disc degeneration, unspecified cervical region: Secondary | ICD-10-CM

## 2015-10-07 DIAGNOSIS — M5136 Other intervertebral disc degeneration, lumbar region: Secondary | ICD-10-CM

## 2015-10-07 DIAGNOSIS — M47816 Spondylosis without myelopathy or radiculopathy, lumbar region: Secondary | ICD-10-CM

## 2015-10-07 DIAGNOSIS — M791 Myalgia: Secondary | ICD-10-CM | POA: Diagnosis not present

## 2015-10-07 DIAGNOSIS — M79604 Pain in right leg: Secondary | ICD-10-CM | POA: Diagnosis present

## 2015-10-07 MED ORDER — HYDROCODONE-ACETAMINOPHEN 10-325 MG PO TABS: ORAL_TABLET | ORAL | Status: DC

## 2015-10-07 NOTE — Progress Notes (Signed)
   Subjective:    Patient ID: Martin Golden, male    DOB: 1951-01-18, 65 y.o.   MRN: [id number]  HPI  The patient is a 65 year old gentleman who returns to pain management for further evaluation and treatment of pain involving the lower back and lower extremity regions. The patient has had significant improvement of his pain with prior treatment performed in pain management Center. The patient states that he felt so good that he walk with his daughter for considerable distance and now is experiencing pain involving the left lower extremity to significant degree. We reviewed patient's MRI and discussed performing lumbar epidural steroid injection as well as lumbosacral selective nerve root blocks. The patient wishes to proceed with lumbosacral selective nerve root block at time return appointment in attempt to decrease severity of symptoms, minimize progression of symptoms, and avoid the need for more involved treatment. The patient was in agreement with suggested treatment plan. We will continue patient's hydrocodone acetaminophen as prescribed at this time.     Review of Systems     Objective:   Physical Exam There was mild tenderness of the splenius capitis and occipitalis musculature regions. Palpation over the cervical facet cervical paraspinal musculature regions reproduce mild discomfort. Patient was with bilaterally equal grip strength and Tinel and Phalen's maneuver were without increased pain of significant degree. There was mild tenderness of the acromioclavicular and glenohumeral joint regions. The patient was with unremarkable Spurling's maneuver. Palpation over the thoracic facet thoracic paraspinal musculature region reproduced mild to moderate discomfort in the lower thoracic region. No crepitus of the thoracic region was noted. Outpatient over the lumbar paraspinal musculature region lumbar facet region was attends to palpation of moderate to moderately severe degree. There was  tenderness over the PSIS and PII S region a mild to moderate degree. Straight leg raising was decreased on the left compared to the right especially decreased sensation along the L5 dermatomal distribution with negative clonus negative Homans. DTRs were difficult to this patient had difficulty relaxing. Abdomen nontender with no costovertebral tenderness noted       Assessment & Plan:     Degenerative disc disease lumbar spine Lumbar spondylosis with degenerative disc disease causing multilevel central foraminal and subarticular lateral recess stenosis  Lumbar facet syndrome  Lumbar radiculopathy  Lumbar stenosis  History of right renal cyst with suspected left renal atrophy  Carcinoma of prostate     PLAN   Continue present medication hydrocodone acetaminophen  Lumbosacral selective nerve root blocks to be performed at time of return appointment  F/U PCP Dr. Lisette Grinder III for evaliation of BP and general medical condition  F/U surgical evaluation. Neurosurgical evaluation has been discussed and will be considered F/U neurological evaluation  May consider radiofrequency rhizolysis or intraspinal procedures pending response to present treatment and F/U evaluation   Patient to call Pain Management Center should patient have concerns prior to scheduled return appointmen.

## 2015-10-07 NOTE — Patient Instructions (Addendum)
ccccccccSelective Nerve Root Block Patient Information  Description: Specific nerve roots exit the spinal canal and these nerves can be compressed and inflamed by a bulging disc and bone spurs.  By injecting steroids on the nerve root, we can potentially decrease the inflammation surrounding these nerves, which often leads to decreased pain.  Also, by injecting local anesthesia on the nerve root, this can provide [place] helpful information to give to your referring doctor if it decreases your pain.  Selective nerve root blocks can be done along the spine from the neck to the low back depending on the location of your pain.   After numbing the skin with local anesthesia, a small needle is passed to the nerve root and the position of the needle is verified using x-ray pictures.  After the needle is in correct position, we then deposit the medication.  You may experience a pressure sensation while this is being done.  The entire block usually lasts less than 15 minutes.  Conditions that may be treated with selective nerve root blocks:  Low back and leg pain  Spinal stenosis  Diagnostic block prior to potential surgery  Neck and arm pain  Post laminectomy syndrome  Preparation for the injection:  1. Do not eat any solid food or dairy products within 6 hours of your appointment. 2. You may drink clear liquids up to 2 hours before an appointment.  Clear liquids include water, black coffee, juice or soda.  No milk or cream please. 3. You may take your regular medications, including pain medications, with a sip of water before your appointment.  Diabetics should hold regular insulin (if taken separately) and take 1/2 normal NPH dose the morning of the procedure.  Carry some sugar containing items with you to your appointment. 4. A driver must accompany you and be prepared to drive you home after your procedure. 5. Bring all your current medications with you. 6. An IV may be inserted and sedation may be  given at the discretion of the physician. 7. A blood pressure cuff, EKG, and other monitors will often be applied during the procedure.  Some patients may need to have extra oxygen administered for a short period. 8. You will be asked to provide medical information, including allergies, prior to the procedure.  We must know immediately if you are taking blood  Thinners (like Coumadin) or if you are allergic to IV iodine contrast (dye).  Possible side-effects: All are usually temporary  Bleeding from needle site  Light headedness  Numbness and tingling  Decreased blood pressure  Weakness in arms/legs  Pressure sensation in back/neck  Pain at injection site (several days)  Possible complications: All are extremely rare  Infection  Nerve injury  Spinal headache (a headache wore with upright position)  Call if you experience:  Fever/chills associated with headache or increased back/neck pain  Headache worsened by an upright position  New onset weakness or numbness of an extremity below the injection site  Hives or difficulty breathing (go to the emergency room)  Inflammation or drainage at the injection site(s)  Severe back/neck pain greater than usual  New symptoms which are concerning to you  Please note:  Although the local anesthetic injected can often make your back or neck feel good for several hours after the injection the pain will likely return.  It takes 3-5 days for steroids to work on the nerve root. You may not notice any pain relief for at least one week.  If effective,  we will often do a series of 3 injections spaced 3-6 weeks apart to maximally decrease your pain.    If you have any questions, please call [phone] [facility] Clinic[facility] Discharge Instructions  General Discharge Instructions :  If you need to reach your doctor call: Monday-Friday 8:00 am - 4:00 pm at [phone] or toll free  [phone].  After clinic hours [phone] to have operator reach doctor.  Bring all of your medication bottles to all your appointments in the pain clinic.  To cancel or reschedule your appointment with Pain Management please remember to call 24 hours in advance to avoid a fee.  Refer to the educational materials which you have been given on: General Risks, I had my Procedure. Discharge Instructions, Post Sedation.  Post Procedure Instructions:  The drugs you were given will stay in your system until tomorrow, so for the next 24 hours you should not drive, make any legal decisions or drink any alcoholic beverages.  You may eat anything you prefer, but it is better to start with liquids then soups and crackers, and gradually work up to solid foods.  Please notify your doctor immediately if you have any unusual bleeding, trouble breathing or pain that is not related to your normal pain.  Depending on the type of procedure that was done, some parts of your body may feel week and/or numb.  This usually clears up by tonight or the next day.  Walk with the use of an assistive device or accompanied by an adult for the 24 hours.  You may use ice on the affected area for the first 24 hours.  Put ice in a Ziploc bag and cover with a towel and place against area 15 minutes on 15 minutes off.  You may switch to heat after 24 hours.GENERAL RISKS AND COMPLICATIONS  What are the risk, side effects and possible complications? Generally speaking, most procedures are safe.  However, with any procedure there are risks, side effects, and the possibility of complications.  The risks and complications are dependent upon the sites that are lesioned, or the type of nerve block to be performed.  The closer the procedure is to the spine, the more serious the risks are.  Great care is taken when placing the radio frequency needles, block needles or lesioning probes, but sometimes complications can  occur. 1. Infection: Any time there is an injection through the skin, there is a risk of infection.  This is why sterile conditions are used for these blocks.  There are four possible types of infection. 1. Localized skin infection. 2. Central Nervous System Infection-This can be in the form of Meningitis, which can be deadly. 3. Epidural Infections-This can be in the form of an epidural abscess, which can cause pressure inside of the spine, causing compression of the spinal cord with subsequent paralysis. This would require an emergency surgery to decompress, and there are no guarantees that the patient would recover from the paralysis. 4. Discitis-This is an infection of the intervertebral discs.  It occurs in about 1% of discography procedures.  It is difficult to treat and it may lead to surgery.        2. Pain: the needles have to go through skin and soft tissues, will cause soreness.       3. Damage to internal structures:  The nerves to be lesioned may be near blood vessels or    other nerves which can be potentially damaged.  4. Bleeding: Bleeding is more common if the patient is taking blood thinners such as  aspirin, Coumadin, Ticiid, Plavix, etc., or if he/she have some genetic predisposition  such as hemophilia. Bleeding into the spinal canal can cause compression of the spinal  cord with subsequent paralysis.  This would require an emergency surgery to  decompress and there are no guarantees that the patient would recover from the  paralysis.       5. Pneumothorax:  Puncturing of a lung is a possibility, every time a needle is introduced in  the area of the chest or upper back.  Pneumothorax refers to free air around the  collapsed lung(s), inside of the thoracic cavity (chest cavity).  Another two possible  complications related to a similar event would include: Hemothorax and Chylothorax.   These are variations of the Pneumothorax, where instead of air around the collapsed  lung(s),  you may have blood or chyle, respectively.       6. Spinal headaches: They may occur with any procedures in the area of the spine.       7. Persistent CSF (Cerebro-Spinal Fluid) leakage: This is a rare problem, but may occur  with prolonged intrathecal or epidural catheters either due to the formation of a fistulous  track or a dural tear.       8. Nerve damage: By working so close to the spinal cord, there is always a possibility of  nerve damage, which could be as serious as a permanent spinal cord injury with  paralysis.       9. Death:  Although rare, severe deadly allergic reactions known as "Anaphylactic  reaction" can occur to any of the medications used.      10. Worsening of the symptoms:  We can always make thing worse.  What are the chances of something like this happening? Chances of any of this occuring are extremely low.  By statistics, you have more of a chance of getting killed in a motor vehicle accident: while driving to the hospital than any of the above occurring .  Nevertheless, you should be aware that they are possibilities.  In general, it is similar to taking a shower.  Everybody knows that you can slip, hit your head and get killed.  Does that mean that you should not shower again?  Nevertheless always keep in mind that statistics do not mean anything if you happen to be on the wrong side of them.  Even if a procedure has a 1 (one) in a 1,000,000 (million) chance of going wrong, it you happen to be that one..Also, keep in mind that by statistics, you have more of a chance of having something go wrong when taking medications.  Who should not have this procedure? If you are on a blood thinning medication (e.g. Coumadin, Plavix, see list of "Blood Thinners"), or if you have an active infection going on, you should not have the procedure.  If you are taking any blood thinners, please inform your physician.  How should I prepare for this procedure?  Do not eat or drink anything at  least six hours prior to the procedure.  Bring a driver with you .  It cannot be a taxi.  Come accompanied by an adult that can drive you back, and that is strong enough to help you if your legs get weak or numb from the local anesthetic.  Take all of your medicines the morning of the procedure with just enough water  to swallow them.  If you have diabetes, make sure that you are scheduled to have your procedure done first thing in the morning, whenever possible.  If you have diabetes, take only half of your insulin dose and notify our nurse that you have done so as soon as you arrive at the clinic.  If you are diabetic, but only take blood sugar pills (oral hypoglycemic), then do not take them on the morning of your procedure.  You may take them after you have had the procedure.  Do not take aspirin or any aspirin-containing medications, at least eleven (11) days prior to the procedure.  They may prolong bleeding.  Wear loose fitting clothing that may be easy to take off and that you would not mind if it got stained with Betadine or blood.  Do not wear any jewelry or perfume  Remove any nail coloring.  It will interfere with some of our monitoring equipment.  NOTE: Remember that this is not meant to be interpreted as a complete list of all possible complications.  Unforeseen problems may occur.  BLOOD THINNERS The following drugs contain aspirin or other products, which can cause increased bleeding during surgery and should not be taken for 2 weeks prior to and 1 week after surgery.  If you should need take something for relief of minor pain, you may take acetaminophen which is found in Tylenol,m Datril, Anacin-3 and Panadol. It is not blood thinner. The products listed below are.  Do not take any of the products listed below in addition to any listed on your instruction sheet.  A.P.C or A.P.C with Codeine Codeine Phosphate Capsules #3 Ibuprofen Ridaura  ABC compound Congesprin Imuran  rimadil  Advil Cope Indocin Robaxisal  Alka-Seltzer Effervescent Pain Reliever and Antacid Coricidin or Coricidin-D  Indomethacin Rufen  Alka-Seltzer plus Cold Medicine Cosprin Ketoprofen S-A-C Tablets  Anacin Analgesic Tablets or Capsules Coumadin Korlgesic Salflex  Anacin Extra Strength Analgesic tablets or capsules CP-2 Tablets Lanoril Salicylate  Anaprox Cuprimine Capsules Levenox Salocol  Anexsia-D Dalteparin Magan Salsalate  Anodynos Darvon compound Magnesium Salicylate Sine-off  Ansaid Dasin Capsules Magsal Sodium Salicylate  Anturane Depen Capsules Marnal Soma  APF Arthritis pain formula Dewitt's Pills Measurin Stanback  Argesic Dia-Gesic Meclofenamic Sulfinpyrazone  Arthritis Bayer Timed Release Aspirin Diclofenac Meclomen Sulindac  Arthritis pain formula Anacin Dicumarol Medipren Supac  Analgesic (Safety coated) Arthralgen Diffunasal Mefanamic Suprofen  Arthritis Strength Bufferin Dihydrocodeine Mepro Compound Suprol  Arthropan liquid Dopirydamole Methcarbomol with Aspirin Synalgos  ASA tablets/Enseals Disalcid Micrainin Tagament  Ascriptin Doan's Midol Talwin  Ascriptin A/D Dolene Mobidin Tanderil  Ascriptin Extra Strength Dolobid Moblgesic Ticlid  Ascriptin with Codeine Doloprin or Doloprin with Codeine Momentum Tolectin  Asperbuf Duoprin Mono-gesic Trendar  Aspergum Duradyne Motrin or Motrin IB Triminicin  Aspirin plain, buffered or enteric coated Durasal Myochrisine Trigesic  Aspirin Suppositories Easprin Nalfon Trillsate  Aspirin with Codeine Ecotrin Regular or Extra Strength Naprosyn Uracel  Atromid-S Efficin Naproxen Ursinus  Auranofin Capsules Elmiron Neocylate Vanquish  Axotal Emagrin Norgesic Verin  Azathioprine Empirin or Empirin with Codeine Normiflo Vitamin E  Azolid Emprazil Nuprin Voltaren  Bayer Aspirin plain, buffered or children's or timed BC Tablets or powders Encaprin Orgaran Warfarin Sodium  Buff-a-Comp Enoxaparin Orudis Zorpin  Buff-a-Comp with  Codeine Equegesic Os-Cal-Gesic   Buffaprin Excedrin plain, buffered or Extra Strength Oxalid   Bufferin Arthritis Strength Feldene Oxphenbutazone   Bufferin plain or Extra Strength Feldene Capsules Oxycodone with Aspirin   Bufferin with Codeine Fenoprofen Fenoprofen Pabalate or Pabalate-SF  Buffets II Flogesic Panagesic   Buffinol plain or Extra Strength Florinal or Florinal with Codeine Panwarfarin   Buf-Tabs Flurbiprofen Penicillamine   Butalbital Compound Four-way cold tablets Penicillin   Butazolidin Fragmin Pepto-Bismol   Carbenicillin Geminisyn Percodan   Carna Arthritis Reliever Geopen Persantine   Carprofen Gold's salt Persistin   Chloramphenicol Goody's Phenylbutazone   Chloromycetin Haltrain Piroxlcam   Clmetidine heparin Plaquenil   Cllnoril Hyco-pap Ponstel   Clofibrate Hydroxy chloroquine Propoxyphen         Before stopping any of these medications, be sure to consult the physician who ordered them.  Some, such as Coumadin (Warfarin) are ordered to prevent or treat serious conditions such as "deep thrombosis", "pumonary embolisms", and other heart problems.  The amount of time that you may need off of the medication may also vary with the medication and the reason for which you were taking it.  If you are taking any of these medications, please make sure you notify your pain physician before you undergo any procedures.

## 2015-10-07 NOTE — Progress Notes (Signed)
Safety precautions to be maintained throughout the outpatient stay will include: orient to surroundings, keep bed in low position, maintain call bell within reach at all times, provide assistance with transfer out of bed and ambulation.  

## 2015-10-24 ENCOUNTER — Other Ambulatory Visit: Payer: Self-pay | Admitting: Pain Medicine

## 2015-10-31 ENCOUNTER — Telehealth: Payer: Self-pay | Admitting: Pain Medicine

## 2015-10-31 NOTE — Telephone Encounter (Signed)
Cannot come Monday for procedure, has resched to 11-10-15 for procedure and 11-03-15 for meds Wife had surgery and she is his driver and cannot drive until following week.

## 2015-11-01 NOTE — Telephone Encounter (Signed)
Martin Golden, Thank you 

## 2015-11-03 ENCOUNTER — Ambulatory Visit: Payer: PRIVATE HEALTH INSURANCE | Admitting: Pain Medicine

## 2015-11-03 DIAGNOSIS — R635 Abnormal weight gain: Secondary | ICD-10-CM | POA: Diagnosis not present

## 2015-11-03 DIAGNOSIS — I10 Essential (primary) hypertension: Secondary | ICD-10-CM | POA: Diagnosis not present

## 2015-11-03 DIAGNOSIS — Z6838 Body mass index (BMI) 38.0-38.9, adult: Secondary | ICD-10-CM | POA: Diagnosis not present

## 2015-11-04 ENCOUNTER — Encounter: Payer: Self-pay | Admitting: Pain Medicine

## 2015-11-04 ENCOUNTER — Ambulatory Visit: Payer: Medicare Other | Attending: Pain Medicine | Admitting: Pain Medicine

## 2015-11-04 DIAGNOSIS — M47896 Other spondylosis, lumbar region: Secondary | ICD-10-CM | POA: Insufficient documentation

## 2015-11-04 DIAGNOSIS — N281 Cyst of kidney, acquired: Secondary | ICD-10-CM | POA: Diagnosis not present

## 2015-11-04 DIAGNOSIS — M542 Cervicalgia: Secondary | ICD-10-CM | POA: Diagnosis present

## 2015-11-04 DIAGNOSIS — M48062 Spinal stenosis, lumbar region with neurogenic claudication: Secondary | ICD-10-CM

## 2015-11-04 DIAGNOSIS — M5116 Intervertebral disc disorders with radiculopathy, lumbar region: Secondary | ICD-10-CM | POA: Insufficient documentation

## 2015-11-04 DIAGNOSIS — M4806 Spinal stenosis, lumbar region: Secondary | ICD-10-CM | POA: Diagnosis not present

## 2015-11-04 DIAGNOSIS — M545 Low back pain: Secondary | ICD-10-CM | POA: Diagnosis present

## 2015-11-04 DIAGNOSIS — M5136 Other intervertebral disc degeneration, lumbar region: Secondary | ICD-10-CM

## 2015-11-04 DIAGNOSIS — M5030 Other cervical disc degeneration, unspecified cervical region: Secondary | ICD-10-CM

## 2015-11-04 DIAGNOSIS — M48061 Spinal stenosis, lumbar region without neurogenic claudication: Secondary | ICD-10-CM

## 2015-11-04 DIAGNOSIS — M533 Sacrococcygeal disorders, not elsewhere classified: Secondary | ICD-10-CM

## 2015-11-04 DIAGNOSIS — M47812 Spondylosis without myelopathy or radiculopathy, cervical region: Secondary | ICD-10-CM

## 2015-11-04 DIAGNOSIS — M5416 Radiculopathy, lumbar region: Secondary | ICD-10-CM | POA: Diagnosis not present

## 2015-11-04 DIAGNOSIS — M47817 Spondylosis without myelopathy or radiculopathy, lumbosacral region: Secondary | ICD-10-CM | POA: Diagnosis not present

## 2015-11-04 DIAGNOSIS — M791 Myalgia: Secondary | ICD-10-CM | POA: Diagnosis not present

## 2015-11-04 DIAGNOSIS — M47816 Spondylosis without myelopathy or radiculopathy, lumbar region: Secondary | ICD-10-CM

## 2015-11-04 DIAGNOSIS — C61 Malignant neoplasm of prostate: Secondary | ICD-10-CM | POA: Insufficient documentation

## 2015-11-04 DIAGNOSIS — M79606 Pain in leg, unspecified: Secondary | ICD-10-CM | POA: Diagnosis present

## 2015-11-04 DIAGNOSIS — Z9889 Other specified postprocedural states: Secondary | ICD-10-CM

## 2015-11-04 MED ORDER — HYDROCODONE-ACETAMINOPHEN 10-325 MG PO TABS: ORAL_TABLET | ORAL | Status: DC

## 2015-11-04 NOTE — Progress Notes (Signed)
Subjective:    Patient ID: Martin Golden, male    DOB: [birth year], 65 y.o.   MRN: [id number]  HPI The patient is a 65 year old gentleman who returns to pain management for further evaluation and treatment of pain involving the region of the lower back and lower extremity region predominantly with pain occurring the back of the neck as well as the lesser degree patient states that the lower back lower extremity pain is associated with pain radiating to the right lower extremity as well as the left lower extremity with numbness and tingling occurring. The patient states that the pain and numbness comes more intense as patient spends more time on the feet. Patient is without trauma change in events of daily living the call significant change in symptomatology. We discussed patient's condition and will consider patient for interventional treatment consisting of sacral selective nerve root block to be performed at time of return appointment. We will continue presently prescribed medication assisting of hydrocodone acetaminophen at this time and will consider patient for additional modifications of treatment pending response to treatment and follow-up evaluation. The patient was with understanding and agreed with suggested treatment plan.      Review of Systems     Objective:   Physical Exam  There was tenderness over the splenius capitis and occipitalis musculature regions. Palpation of these regions reproduced pain of mild-to-moderate degree. There was mild to moderate tenderness of the cervical facet cervical paraspinal musculature region. Palpation of the acromioclavicular and glenohumeral joint region were without increased pain of significant degree and patient appeared to be with bilaterally equal grip strength with Tinel and Phalen's maneuver reproducing minimal discomfort. There was unremarkable Spurling's maneuver noted. Palpation over the thoracic facet thoracic paraspinal musculature region  was attends to palpation of degree in the lower thoracic region with no crepitus of the thoracic region noted. There was moderate muscle spasms of the lower thoracic region noted. Palpation over the lumbar paraspinal musculatures and lumbar facet region was attends to palpation of moderately severe degree with lateral bending rotation extension and palpation of the lumbar facets reproducing moderately severe discomfort. Palpation over the PSIS and PII S region reproduces moderate discomfort. There was mild tenderness of the greater trochanteric region and iliotibial band region. There was questionably decreased sensation of the L5 dermatomal distribution with negative clonus negative Homans. DTRs were trace at the knees. EHL strength appeared to be decreased with negative clonus negative Homans. Abdomen was nontender with no costovertebral tenderness noted.      Assessment & Plan:     Degenerative disc disease lumbar spine Lumbar spondylosis with degenerative disc disease causing multilevel central foraminal and subarticular lateral recess stenosis  Lumbar facet syndrome  Lumbar radiculopathy  Lumbar stenosis  History of right renal cyst with suspected left renal atrophy  Carcinoma of prostate     PLAN   Continue present medication: Hydrocodone acetaminophen  Lumbosacral selective nerve root block to be performed at time of return appointment  F/U PCP Dr. Lisette Grinder III for evaliation of  BP and general medical  condition  F/U surgical evaluation. May consider pending follow-up evaluations  Ask the nurses and secretaries the date of your physical therapy appointment in the date of your lumbosacral selective nerve root block  F/U neurological evaluation. May consider pending follow-up evaluations  May consider radiofrequency rhizolysis or intraspinal procedures pending response to present treatment and F/U evaluation   Patient to call [facility] should patient  have concerns prior to scheduled  return appointment

## 2015-11-04 NOTE — Patient Instructions (Addendum)
PLAN   Continue present medication: Hydrocodone acetaminophen  Lumbosacral selective nerve root block to be performed at time of return appointment  F/U PCP Dr. Lisette Grinder III for evaliation of  BP and general medical  condition  F/U surgical evaluation. May consider pending follow-up evaluations  Ask the nurses and secretaries the date of your physical therapy appointment in the date of your lumbosacral selective nerve root block  F/U neurological evaluation. May consider pending follow-up evaluations  May consider radiofrequency rhizolysis or intraspinal procedures pending response to present treatment and F/U evaluation   Patient to call [facility] should patient have concerns prior to scheduled return appointment.Selective Nerve Root Block Patient Information  Description: Specific nerve roots exit the spinal canal and these nerves can be compressed and inflamed by a bulging disc and bone spurs.  By injecting steroids on the nerve root, we can potentially decrease the inflammation surrounding these nerves, which often leads to decreased pain.  Also, by injecting local anesthesia on the nerve root, this can provide [place] helpful information to give to your referring doctor if it decreases your pain.  Selective nerve root blocks can be done along the spine from the neck to the low back depending on the location of your pain.   After numbing the skin with local anesthesia, a small needle is passed to the nerve root and the position of the needle is verified using x-ray pictures.  After the needle is in correct position, we then deposit the medication.  You may experience a pressure sensation while this is being done.  The entire block usually lasts less than 15 minutes.  Conditions that may be treated with selective nerve root blocks:  Low back and leg pain  Spinal stenosis  Diagnostic block prior to potential surgery  Neck and arm pain  Post laminectomy  syndrome  Preparation for the injection:  1. Do not eat any solid food or dairy products within 6 hours of your appointment. 2. You may drink clear liquids up to 2 hours before an appointment.  Clear liquids include water, black coffee, juice or soda.  No milk or cream please. 3. You may take your regular medications, including pain medications, with a sip of water before your appointment.  Diabetics should hold regular insulin (if taken separately) and take 1/2 normal NPH dose the morning of the procedure.  Carry some sugar containing items with you to your appointment. 4. A driver must accompany you and be prepared to drive you home after your procedure. 5. Bring all your current medications with you. 6. An IV may be inserted and sedation may be given at the discretion of the physician. 7. A blood pressure cuff, EKG, and other monitors will often be applied during the procedure.  Some patients may need to have extra oxygen administered for a short period. 8. You will be asked to provide medical information, including allergies, prior to the procedure.  We must know immediately if you are taking blood  Thinners (like Coumadin) or if you are allergic to IV iodine contrast (dye).  Possible side-effects: All are usually temporary  Bleeding from needle site  Light headedness  Numbness and tingling  Decreased blood pressure  Weakness in arms/legs  Pressure sensation in back/neck  Pain at injection site (several days)  Possible complications: All are extremely rare  Infection  Nerve injury  Spinal headache (a headache wore with upright position)  Call if you experience:  Fever/chills associated with headache or increased  back/neck pain  Headache worsened by an upright position  New onset weakness or numbness of an extremity below the injection site  Hives or difficulty breathing (go to the emergency room)  Inflammation or drainage at the injection site(s)  Severe  back/neck pain greater than usual  New symptoms which are concerning to you  Please note:  Although the local anesthetic injected can often make your back or neck feel good for several hours after the injection the pain will likely return.  It takes 3-5 days for steroids to work on the nerve root. You may not notice any pain relief for at least one week.  If effective, we will often do a series of 3 injections spaced 3-6 weeks apart to maximally decrease your pain.    If you have any questions, please call [phone] [facility]

## 2015-11-10 ENCOUNTER — Ambulatory Visit: Payer: PRIVATE HEALTH INSURANCE | Admitting: Pain Medicine

## 2015-11-10 DIAGNOSIS — F322 Major depressive disorder, single episode, severe without psychotic features: Secondary | ICD-10-CM | POA: Diagnosis not present

## 2015-11-28 ENCOUNTER — Telehealth: Payer: Self-pay | Admitting: Pain Medicine

## 2015-11-28 NOTE — Telephone Encounter (Signed)
Martin Golden, and Nurses, Please reschedule patient for his procedure Lumbosacral Selective Nerve Root Blocks for Wednesday, December 17, 2015 Thank you

## 2015-11-28 NOTE — Telephone Encounter (Signed)
Patient left vmail on 11-27-15 at 4:59 stating his wife could not bring him to appt on Mon 12-01-15 he would need to resched appt to after the 10th, he can keep appt for tue 28th for meds at which time we will resched his procedure appt.

## 2015-12-01 ENCOUNTER — Ambulatory Visit: Payer: PRIVATE HEALTH INSURANCE | Admitting: Pain Medicine

## 2015-12-02 ENCOUNTER — Encounter: Payer: Self-pay | Admitting: Pain Medicine

## 2015-12-02 ENCOUNTER — Ambulatory Visit: Payer: Medicare Other | Attending: Pain Medicine | Admitting: Pain Medicine

## 2015-12-02 VITALS — BP 106/57 | HR 93 | Temp 97.8°F | Resp 18 | Ht 73.0 in | Wt 270.0 lb

## 2015-12-02 DIAGNOSIS — C61 Malignant neoplasm of prostate: Secondary | ICD-10-CM | POA: Insufficient documentation

## 2015-12-02 DIAGNOSIS — Z9889 Other specified postprocedural states: Secondary | ICD-10-CM

## 2015-12-02 DIAGNOSIS — M791 Myalgia: Secondary | ICD-10-CM | POA: Diagnosis not present

## 2015-12-02 DIAGNOSIS — M533 Sacrococcygeal disorders, not elsewhere classified: Secondary | ICD-10-CM | POA: Diagnosis not present

## 2015-12-02 DIAGNOSIS — M4806 Spinal stenosis, lumbar region: Secondary | ICD-10-CM | POA: Insufficient documentation

## 2015-12-02 DIAGNOSIS — M47816 Spondylosis without myelopathy or radiculopathy, lumbar region: Secondary | ICD-10-CM

## 2015-12-02 DIAGNOSIS — N281 Cyst of kidney, acquired: Secondary | ICD-10-CM | POA: Diagnosis not present

## 2015-12-02 DIAGNOSIS — M545 Low back pain: Secondary | ICD-10-CM | POA: Diagnosis present

## 2015-12-02 DIAGNOSIS — M5116 Intervertebral disc disorders with radiculopathy, lumbar region: Secondary | ICD-10-CM | POA: Diagnosis not present

## 2015-12-02 DIAGNOSIS — M5030 Other cervical disc degeneration, unspecified cervical region: Secondary | ICD-10-CM

## 2015-12-02 DIAGNOSIS — M48061 Spinal stenosis, lumbar region without neurogenic claudication: Secondary | ICD-10-CM

## 2015-12-02 DIAGNOSIS — M5416 Radiculopathy, lumbar region: Secondary | ICD-10-CM

## 2015-12-02 DIAGNOSIS — M5136 Other intervertebral disc degeneration, lumbar region: Secondary | ICD-10-CM

## 2015-12-02 DIAGNOSIS — M47896 Other spondylosis, lumbar region: Secondary | ICD-10-CM | POA: Diagnosis not present

## 2015-12-02 DIAGNOSIS — M51369 Other intervertebral disc degeneration, lumbar region without mention of lumbar back pain or lower extremity pain: Secondary | ICD-10-CM

## 2015-12-02 DIAGNOSIS — M47812 Spondylosis without myelopathy or radiculopathy, cervical region: Secondary | ICD-10-CM

## 2015-12-02 DIAGNOSIS — M48062 Spinal stenosis, lumbar region with neurogenic claudication: Secondary | ICD-10-CM

## 2015-12-02 DIAGNOSIS — M79606 Pain in leg, unspecified: Secondary | ICD-10-CM | POA: Diagnosis present

## 2015-12-02 DIAGNOSIS — M47817 Spondylosis without myelopathy or radiculopathy, lumbosacral region: Secondary | ICD-10-CM | POA: Diagnosis not present

## 2015-12-02 MED ORDER — HYDROCODONE-ACETAMINOPHEN 10-325 MG PO TABS: ORAL_TABLET | ORAL | Status: DC

## 2015-12-02 NOTE — Progress Notes (Signed)
Safety precautions to be maintained throughout the outpatient stay will include: orient to surroundings, keep bed in low position, maintain call bell within reach at all times, provide assistance with transfer out of bed and ambulation.  

## 2015-12-02 NOTE — Patient Instructions (Addendum)
PLAN   Continue present medication: Hydrocodone acetaminophen  Lumbosacral selective nerve root block to be performed at time of return appointment  F/U PCP Dr. Lisette Grinder III for evaliation of  BP and general medical  condition  F/U surgical evaluation. May consider pending follow-up evaluations  F/U neurological evaluation. May consider PNCV/EMG studies and other studies pending follow-up evaluations  May consider radiofrequency rhizolysis or intraspinal procedures pending response to present treatment and F/U evaluation   Patient to call [facility] should patient have concerns prior to scheduled return appointment.

## 2015-12-02 NOTE — Progress Notes (Signed)
   Subjective:    Patient ID: Martin Golden, male    DOB: [birth year], 65 y.o.   MRN: [id number]  HPI  The patient is a 65 year old gentleman who returns to pain management for further evaluation and treatment of pain involving the lower back and lower extremity region. The patient states that he has pain radiation the lumbar region to the lower extremity on the left predominant. The patient denies any recent trauma change in events of daily living the call significant change in symptomatology. The patient continues medication hydrocodone acetaminophen. We will proceed with lumbosacral selective nerve root block at time return appointment in attempt to decrease severity of symptoms, minimize progression of symptoms, and avoid the need for more involved treatment. The patient was with understanding and agreed to suggested treatment plan. The patient states that the pain becomes more intense as the spends more time on the feet. The patient is without plans for surgical intervention. We will proceed with lumbosacral selective nerve root blocks to be performed at time of return appointment. All in agreement with suggested treatment plan      Review of Systems     Objective:   Physical Exam  There was tenderness to palpation of the splenius capitis and occipitalis musculature region a mild degree. There was mild tenderness of the cervical facet region. Palpation of the acromioclavicular and glenohumeral joint regions were without increased pain of significant degree. The patient appeared to be with bilaterally equal grip strength and Tinel and Phalen's maneuver were without increase of pain of significant degree. Palpation over the thoracic facet thoracic paraspinal musculature region was with evidence of moderate muscle spasms of the lower thoracic region with no crepitus of the thoracic region noted. Palpation over the lumbar paraspinal musculatures and lumbar facet region was attends to palpation of  moderate to moderately severe degree. Lateral bending rotation extension and palpation of the lumbar facets reproduced moderate to moderately severe discomfort there was moderate tenderness over the PSIS and PII S region. Straight leg raising was limited to approximately 20 without a definite increase of pain with dorsiflexion noted. DTRs were difficult to elicit. EHL strength appeared to be slightly decreased. There was negative clonus negative Homans. Abdomen was nontender with no costovertebral tenderness noted      Assessment & Plan:      Degenerative disc disease lumbar spine Lumbar spondylosis with degenerative disc disease causing multilevel central foraminal and subarticular lateral recess stenosis  Lumbar facet syndrome  Lumbar radiculopathy  Lumbar stenosis  History of right renal cyst with suspected left renal atrophy  Carcinoma of prostate     PLAN   Continue present medication: Hydrocodone acetaminophen  Lumbosacral selective nerve root block to be performed at time of return appointment  F/U PCP Dr. Lisette Grinder III for evaliation of  BP and general medical  condition  F/U surgical evaluation. May consider pending follow-up evaluations  F/U neurological evaluation. May consider PNCV/EMG studies and other studies pending follow-up evaluations  May consider radiofrequency rhizolysis or intraspinal procedures pending response to present treatment and F/U evaluation   Patient to call [facility] Center should patient have concerns prior to scheduled return appointment.

## 2015-12-15 ENCOUNTER — Encounter: Payer: Self-pay | Admitting: Pain Medicine

## 2015-12-15 ENCOUNTER — Ambulatory Visit: Payer: Medicare Other | Attending: Pain Medicine | Admitting: Pain Medicine

## 2015-12-15 VITALS — BP 136/68 | HR 85 | Temp 98.0°F | Resp 12 | Ht 73.0 in | Wt 270.0 lb

## 2015-12-15 DIAGNOSIS — M533 Sacrococcygeal disorders, not elsewhere classified: Secondary | ICD-10-CM

## 2015-12-15 DIAGNOSIS — M5030 Other cervical disc degeneration, unspecified cervical region: Secondary | ICD-10-CM

## 2015-12-15 DIAGNOSIS — M5136 Other intervertebral disc degeneration, lumbar region: Secondary | ICD-10-CM | POA: Insufficient documentation

## 2015-12-15 DIAGNOSIS — M48062 Spinal stenosis, lumbar region with neurogenic claudication: Secondary | ICD-10-CM

## 2015-12-15 DIAGNOSIS — M47812 Spondylosis without myelopathy or radiculopathy, cervical region: Secondary | ICD-10-CM

## 2015-12-15 DIAGNOSIS — M79606 Pain in leg, unspecified: Secondary | ICD-10-CM | POA: Diagnosis present

## 2015-12-15 DIAGNOSIS — M48061 Spinal stenosis, lumbar region without neurogenic claudication: Secondary | ICD-10-CM

## 2015-12-15 DIAGNOSIS — M47816 Spondylosis without myelopathy or radiculopathy, lumbar region: Secondary | ICD-10-CM | POA: Diagnosis not present

## 2015-12-15 DIAGNOSIS — M545 Low back pain: Secondary | ICD-10-CM | POA: Insufficient documentation

## 2015-12-15 DIAGNOSIS — M5416 Radiculopathy, lumbar region: Secondary | ICD-10-CM

## 2015-12-15 DIAGNOSIS — M4806 Spinal stenosis, lumbar region: Secondary | ICD-10-CM | POA: Diagnosis not present

## 2015-12-15 DIAGNOSIS — Z9889 Other specified postprocedural states: Secondary | ICD-10-CM

## 2015-12-15 MED ORDER — CIPROFLOXACIN IN D5W 400 MG/200ML IV SOLN
INTRAVENOUS | Status: AC
  Filled 2015-12-15: qty 200

## 2015-12-15 MED ORDER — CIPROFLOXACIN HCL 250 MG PO TABS: 250.0000 mg | ORAL_TABLET | Freq: Two times a day (BID) | ORAL | Status: DC

## 2015-12-15 MED ORDER — MIDAZOLAM HCL 5 MG/5ML IJ SOLN: 5.0000 mg | Freq: Once | INTRAMUSCULAR | Status: DC

## 2015-12-15 MED ORDER — FENTANYL CITRATE (PF) 100 MCG/2ML IJ SOLN
INTRAMUSCULAR | Status: AC
  Administered 2015-12-15: 100 ug
  Filled 2015-12-15: qty 2

## 2015-12-15 MED ORDER — ORPHENADRINE CITRATE 30 MG/ML IJ SOLN
INTRAMUSCULAR | Status: AC
  Administered 2015-12-15: 12:00:00
  Filled 2015-12-15: qty 2

## 2015-12-15 MED ORDER — BUPIVACAINE HCL (PF) 0.25 % IJ SOLN
INTRAMUSCULAR | Status: AC
  Administered 2015-12-15: 12:00:00
  Filled 2015-12-15: qty 30

## 2015-12-15 MED ORDER — CIPROFLOXACIN IN D5W 400 MG/200ML IV SOLN
400.0000 mg | Freq: Once | INTRAVENOUS | Status: AC
  Administered 2015-12-15: 12:00:00 via INTRAVENOUS

## 2015-12-15 MED ORDER — LIDOCAINE HCL (PF) 1 % IJ SOLN: 10.0000 mL | Freq: Once | INTRAMUSCULAR | Status: DC

## 2015-12-15 MED ORDER — BUPIVACAINE HCL (PF) 0.25 % IJ SOLN: 30.0000 mL | Freq: Once | INTRAMUSCULAR | Status: DC

## 2015-12-15 MED ORDER — MIDAZOLAM HCL 5 MG/5ML IJ SOLN
INTRAMUSCULAR | Status: AC
  Administered 2015-12-15: 4 mg
  Filled 2015-12-15: qty 5

## 2015-12-15 MED ORDER — FENTANYL CITRATE (PF) 100 MCG/2ML IJ SOLN: 100.0000 ug | Freq: Once | INTRAMUSCULAR | Status: DC

## 2015-12-15 MED ORDER — TRIAMCINOLONE ACETONIDE 40 MG/ML IJ SUSP: 40.0000 mg | Freq: Once | INTRAMUSCULAR | Status: DC

## 2015-12-15 MED ORDER — LIDOCAINE HCL (PF) 1 % IJ SOLN
INTRAMUSCULAR | Status: AC
  Administered 2015-12-15: 12:00:00
  Filled 2015-12-15: qty 5

## 2015-12-15 MED ORDER — TRIAMCINOLONE ACETONIDE 40 MG/ML IJ SUSP
INTRAMUSCULAR | Status: AC
  Administered 2015-12-15: 12:00:00
  Filled 2015-12-15: qty 1

## 2015-12-15 MED ORDER — ORPHENADRINE CITRATE 30 MG/ML IJ SOLN: 60.0000 mg | Freq: Once | INTRAMUSCULAR | Status: DC

## 2015-12-15 MED ORDER — LACTATED RINGERS IV SOLN: 1000.0000 mL | INTRAVENOUS | Status: DC

## 2015-12-15 NOTE — Progress Notes (Signed)
Patient her for procedure d/t lower back pain. Safety precautions to be maintained throughout the outpatient stay will include: orient to surroundings, keep bed in low position, maintain call bell within reach at all times, provide assistance with transfer out of bed and ambulation.  

## 2015-12-15 NOTE — Progress Notes (Signed)
   Subjective:    Patient ID: Martin Golden, male    DOB: 1951/07/12, 65 y.o.   MRN: [id number]  HPI  PROCEDURE PERFORMED: Lumbosacral selective nerve root block   NOTE: The patient is a 65 y.o. male who returns to [place] for further evaluation and treatment of pain involving the lumbar and lower extremity region. Studies consisting of MRI has revealed the patient to be with evidence of degenerative disc disease lumbar spine Lumbar spondylosis with degenerative disc disease causing multilevel central foraminal and subarticular lateral recess stenosis. There is concern regarding intraspinal abnormalities contributing to the patient's symptomatology. The risks, benefits, and expectations of the procedure have been explained to the patient who was understanding and in agreement with suggested treatment plan. We will proceed with interventional treatment as discussed and as explained to the patient. The patient is understanding and in agreement with suggested treatment plan.   DESCRIPTION OF PROCEDURE: Lumbosacral selective nerve root block with IV Versed, IV fentanyl conscious sedation, EKG, blood pressure, pulse, and pulse oximetry monitoring. The procedure was performed with the patient in the prone position under fluoroscopic guidance. With the patient in the prone position, Betadine prep of proposed entry site was performed. Local anesthetic skin wheal of proposed needle entry site was prepared with 1.5% plain lidocaine with AP view of the lumbosacral spine.   PROCEDURE #1: Needle placement at the left L 2 vertebral body: A 22 -gauge needle was inserted at the inferior border of the transverse process of the vertebral body with needle placed medial to the midline of the transverse process on AP view of the lumbosacral spine.   NEEDLE PLACEMENT AT  L3, L4, and L5  VERTEBRAL BODY LEVELS  Needle  placement was accomplished at L3, L4, and L5  vertebral body levels on the left side  exactly as was accomplished at the L2  vertebral body level  and utilizing the same technique and under fluoroscopic guidance.    Needle placement was then verified on lateral view at all levels with needle tip documented to be in the posterior superior quadrant of the intervertebral foramen of  L 2, L3, L4, and L5. Following negative aspiration for heme and CSF at each level, each level was injected with 3 mL of 0.25% bupivacaine with Kenalog.  The patient tolerated the procedure well.   A total of 10 mg of Kenalog was utilized for the procedure.   PLAN:  1. Medications: Will continue presently prescribed medication hydrocodone acetaminophen. 2. The patient is to undergo follow-up evaluation with PCP Dr. Lisette Grinder III  for evaluation of blood pressure and general medical condition status post procedure performed on today's visit. 3. Surgical follow-up evaluation.Has been addressed  4. Neurological evaluation.Has been addressed including PNCV EMG studies  5. May consider radiofrequency procedures, implantation type procedures and other treatment pending response to treatment and follow-up evaluation. 6. The patient has been advise do adhere to proper body mechanics and avoid activities which may aggravate condition. 7. The patient has been advised to call the [facility] Management Center prior to scheduled return appointment should there be significant change in the patient's condition or should the patient have other concerns regarding condition prior to scheduled return appointment.   Review of Systems     Objective:   Physical Exam        Assessment & Plan:

## 2015-12-15 NOTE — Patient Instructions (Addendum)
PLAN  Continue present medication hydrocodone acetaminophen and begin taking antibiotic Cipro as prescribed. Please obtain your antibiotic Cipro today and begin taking Cipro today  F/U PCP Dr. Lisette Grinder III for  evaliation of  BP and general medical  condition.  F/U surgical evaluation. May consider pending follow-up evaluations  F/U neurological evaluation. May consider pending follow-up evaluations  May consider radiofrequency rhizolysis or intraspinal procedures pending response to present treatment and F/U evaluation  Patient to call [facility] should patient have concerns prior to scheduled return appointmentGENERAL RISKS AND COMPLICATIONS  What are the risk, side effects and possible complications? Generally speaking, most procedures are safe.  However, with any procedure there are risks, side effects, and the possibility of complications.  The risks and complications are dependent upon the sites that are lesioned, or the type of nerve block to be performed.  The closer the procedure is to the spine, the more serious the risks are.  Great care is taken when placing the radio frequency needles, block needles or lesioning probes, but sometimes complications can occur. 1. Infection: Any time there is an injection through the skin, there is a risk of infection.  This is why sterile conditions are used for these blocks.  There are four possible types of infection. 1. Localized skin infection. 2. Central Nervous System Infection-This can be in the form of Meningitis, which can be deadly. 3. Epidural Infections-This can be in the form of an epidural abscess, which can cause pressure inside of the spine, causing compression of the spinal cord with subsequent paralysis. This would require an emergency surgery to decompress, and there are no guarantees that the patient would recover from the paralysis. 4. Discitis-This is an infection of the intervertebral discs.  It occurs in about 1%  of discography procedures.  It is difficult to treat and it may lead to surgery.        2. Pain: the needles have to go through skin and soft tissues, will cause soreness.       3. Damage to internal structures:  The nerves to be lesioned may be near blood vessels or    other nerves which can be potentially damaged.       4. Bleeding: Bleeding is more common if the patient is taking blood thinners such as  aspirin, Coumadin, Ticiid, Plavix, etc., or if he/she have some genetic predisposition  such as hemophilia. Bleeding into the spinal canal can cause compression of the spinal  cord with subsequent paralysis.  This would require an emergency surgery to  decompress and there are no guarantees that the patient would recover from the  paralysis.       5. Pneumothorax:  Puncturing of a lung is a possibility, every time a needle is introduced in  the area of the chest or upper back.  Pneumothorax refers to free air around the  collapsed lung(s), inside of the thoracic cavity (chest cavity).  Another two possible  complications related to a similar event would include: Hemothorax and Chylothorax.   These are variations of the Pneumothorax, where instead of air around the collapsed  lung(s), you may have blood or chyle, respectively.       6. Spinal headaches: They may occur with any procedures in the area of the spine.       7. Persistent CSF (Cerebro-Spinal Fluid) leakage: This is a rare problem, but may occur  with prolonged intrathecal or epidural catheters either due to the formation of a  fistulous  track or a dural tear.       8. Nerve damage: By working so close to the spinal cord, there is always a possibility of  nerve damage, which could be as serious as a permanent spinal cord injury with  paralysis.       9. Death:  Although rare, severe deadly allergic reactions known as "Anaphylactic  reaction" can occur to any of the medications used.      10. Worsening of the symptoms:  We can always make thing  worse.  What are the chances of something like this happening? Chances of any of this occuring are extremely low.  By statistics, you have more of a chance of getting killed in a motor vehicle accident: while driving to the hospital than any of the above occurring .  Nevertheless, you should be aware that they are possibilities.  In general, it is similar to taking a shower.  Everybody knows that you can slip, hit your head and get killed.  Does that mean that you should not shower again?  Nevertheless always keep in mind that statistics do not mean anything if you happen to be on the wrong side of them.  Even if a procedure has a 1 (one) in a 1,000,000 (million) chance of going wrong, it you happen to be that one..Also, keep in mind that by statistics, you have more of a chance of having something go wrong when taking medications.  Who should not have this procedure? If you are on a blood thinning medication (e.g. Coumadin, Plavix, see list of "Blood Thinners"), or if you have an active infection going on, you should not have the procedure.  If you are taking any blood thinners, please inform your physician.  How should I prepare for this procedure?  Do not eat or drink anything at least six hours prior to the procedure.  Bring a driver with you .  It cannot be a taxi.  Come accompanied by an adult that can drive you back, and that is strong enough to help you if your legs get weak or numb from the local anesthetic.  Take all of your medicines the morning of the procedure with just enough water to swallow them.  If you have diabetes, make sure that you are scheduled to have your procedure done first thing in the morning, whenever possible.  If you have diabetes, take only half of your insulin dose and notify our nurse that you have done so as soon as you arrive at the clinic.  If you are diabetic, but only take blood sugar pills (oral hypoglycemic), then do not take them on the morning of your  procedure.  You may take them after you have had the procedure.  Do not take aspirin or any aspirin-containing medications, at least eleven (11) days prior to the procedure.  They may prolong bleeding.  Wear loose fitting clothing that may be easy to take off and that you would not mind if it got stained with Betadine or blood.  Do not wear any jewelry or perfume  Remove any nail coloring.  It will interfere with some of our monitoring equipment.  NOTE: Remember that this is not meant to be interpreted as a complete list of all possible complications.  Unforeseen problems may occur.  BLOOD THINNERS The following drugs contain aspirin or other products, which can cause increased bleeding during surgery and should not be taken for 2 weeks prior to and 1 week  after surgery.  If you should need take something for relief of minor pain, you may take acetaminophen which is found in Tylenol,m Datril, Anacin-3 and Panadol. It is not blood thinner. The products listed below are.  Do not take any of the products listed below in addition to any listed on your instruction sheet.  A.P.C or A.P.C with Codeine Codeine Phosphate Capsules #3 Ibuprofen Ridaura  ABC compound Congesprin Imuran rimadil  Advil Cope Indocin Robaxisal  Alka-Seltzer Effervescent Pain Reliever and Antacid Coricidin or Coricidin-D  Indomethacin Rufen  Alka-Seltzer plus Cold Medicine Cosprin Ketoprofen S-A-C Tablets  Anacin Analgesic Tablets or Capsules Coumadin Korlgesic Salflex  Anacin Extra Strength Analgesic tablets or capsules CP-2 Tablets Lanoril Salicylate  Anaprox Cuprimine Capsules Levenox Salocol  Anexsia-D Dalteparin Magan Salsalate  Anodynos Darvon compound Magnesium Salicylate Sine-off  Ansaid Dasin Capsules Magsal Sodium Salicylate  Anturane Depen Capsules Marnal Soma  APF Arthritis pain formula Dewitt's Pills Measurin Stanback  Argesic Dia-Gesic Meclofenamic Sulfinpyrazone  Arthritis Bayer Timed Release Aspirin  Diclofenac Meclomen Sulindac  Arthritis pain formula Anacin Dicumarol Medipren Supac  Analgesic (Safety coated) Arthralgen Diffunasal Mefanamic Suprofen  Arthritis Strength Bufferin Dihydrocodeine Mepro Compound Suprol  Arthropan liquid Dopirydamole Methcarbomol with Aspirin Synalgos  ASA tablets/Enseals Disalcid Micrainin Tagament  Ascriptin Doan's Midol Talwin  Ascriptin A/D Dolene Mobidin Tanderil  Ascriptin Extra Strength Dolobid Moblgesic Ticlid  Ascriptin with Codeine Doloprin or Doloprin with Codeine Momentum Tolectin  Asperbuf Duoprin Mono-gesic Trendar  Aspergum Duradyne Motrin or Motrin IB Triminicin  Aspirin plain, buffered or enteric coated Durasal Myochrisine Trigesic  Aspirin Suppositories Easprin Nalfon Trillsate  Aspirin with Codeine Ecotrin Regular or Extra Strength Naprosyn Uracel  Atromid-S Efficin Naproxen Ursinus  Auranofin Capsules Elmiron Neocylate Vanquish  Axotal Emagrin Norgesic Verin  Azathioprine Empirin or Empirin with Codeine Normiflo Vitamin E  Azolid Emprazil Nuprin Voltaren  Bayer Aspirin plain, buffered or children's or timed BC Tablets or powders Encaprin Orgaran Warfarin Sodium  Buff-a-Comp Enoxaparin Orudis Zorpin  Buff-a-Comp with Codeine Equegesic Os-Cal-Gesic   Buffaprin Excedrin plain, buffered or Extra Strength Oxalid   Bufferin Arthritis Strength Feldene Oxphenbutazone   Bufferin plain or Extra Strength Feldene Capsules Oxycodone with Aspirin   Bufferin with Codeine Fenoprofen Fenoprofen Pabalate or Pabalate-SF   Buffets II Flogesic Panagesic   Buffinol plain or Extra Strength Florinal or Florinal with Codeine Panwarfarin   Buf-Tabs Flurbiprofen Penicillamine   Butalbital Compound Four-way cold tablets Penicillin   Butazolidin Fragmin Pepto-Bismol   Carbenicillin Geminisyn Percodan   Carna Arthritis Reliever Geopen Persantine   Carprofen Gold's salt Persistin   Chloramphenicol Goody's Phenylbutazone   Chloromycetin Haltrain Piroxlcam    Clmetidine heparin Plaquenil   Cllnoril Hyco-pap Ponstel   Clofibrate Hydroxy chloroquine Propoxyphen         Before stopping any of these medications, be sure to consult the physician who ordered them.  Some, such as Coumadin (Warfarin) are ordered to prevent or treat serious conditions such as "deep thrombosis", "pumonary embolisms", and other heart problems.  The amount of time that you may need off of the medication may also vary with the medication and the reason for which you were taking it.  If you are taking any of these medications, please make sure you notify your pain physician before you undergo any procedures.

## 2015-12-16 ENCOUNTER — Telehealth: Payer: Self-pay | Admitting: *Deleted

## 2015-12-16 ENCOUNTER — Encounter: Payer: Self-pay | Admitting: Physical Medicine & Rehabilitation

## 2015-12-16 DIAGNOSIS — Z6838 Body mass index (BMI) 38.0-38.9, adult: Secondary | ICD-10-CM | POA: Diagnosis not present

## 2015-12-16 DIAGNOSIS — I10 Essential (primary) hypertension: Secondary | ICD-10-CM | POA: Diagnosis not present

## 2015-12-16 DIAGNOSIS — R635 Abnormal weight gain: Secondary | ICD-10-CM | POA: Diagnosis not present

## 2015-12-16 NOTE — Telephone Encounter (Signed)
Left voicemail with patient to call our office if there are any questions or concerns re; procedure.

## 2015-12-30 ENCOUNTER — Ambulatory Visit: Payer: Medicare Other | Attending: Pain Medicine | Admitting: Pain Medicine

## 2015-12-30 ENCOUNTER — Encounter: Payer: Self-pay | Admitting: Pain Medicine

## 2015-12-30 VITALS — BP 105/67 | HR 97 | Temp 98.3°F | Resp 16 | Ht 73.0 in | Wt 270.0 lb

## 2015-12-30 DIAGNOSIS — Z9889 Other specified postprocedural states: Secondary | ICD-10-CM | POA: Diagnosis not present

## 2015-12-30 DIAGNOSIS — N281 Cyst of kidney, acquired: Secondary | ICD-10-CM | POA: Insufficient documentation

## 2015-12-30 DIAGNOSIS — M533 Sacrococcygeal disorders, not elsewhere classified: Secondary | ICD-10-CM

## 2015-12-30 DIAGNOSIS — M5116 Intervertebral disc disorders with radiculopathy, lumbar region: Secondary | ICD-10-CM | POA: Diagnosis not present

## 2015-12-30 DIAGNOSIS — M47812 Spondylosis without myelopathy or radiculopathy, cervical region: Secondary | ICD-10-CM

## 2015-12-30 DIAGNOSIS — M5136 Other intervertebral disc degeneration, lumbar region: Secondary | ICD-10-CM

## 2015-12-30 DIAGNOSIS — M5382 Other specified dorsopathies, cervical region: Secondary | ICD-10-CM | POA: Diagnosis not present

## 2015-12-30 DIAGNOSIS — M4806 Spinal stenosis, lumbar region: Secondary | ICD-10-CM | POA: Insufficient documentation

## 2015-12-30 DIAGNOSIS — M5416 Radiculopathy, lumbar region: Secondary | ICD-10-CM | POA: Diagnosis not present

## 2015-12-30 DIAGNOSIS — M79606 Pain in leg, unspecified: Secondary | ICD-10-CM | POA: Diagnosis present

## 2015-12-30 DIAGNOSIS — M48061 Spinal stenosis, lumbar region without neurogenic claudication: Secondary | ICD-10-CM

## 2015-12-30 DIAGNOSIS — M47817 Spondylosis without myelopathy or radiculopathy, lumbosacral region: Secondary | ICD-10-CM | POA: Diagnosis not present

## 2015-12-30 DIAGNOSIS — M47896 Other spondylosis, lumbar region: Secondary | ICD-10-CM | POA: Insufficient documentation

## 2015-12-30 DIAGNOSIS — M48062 Spinal stenosis, lumbar region with neurogenic claudication: Secondary | ICD-10-CM

## 2015-12-30 DIAGNOSIS — M545 Low back pain: Secondary | ICD-10-CM | POA: Diagnosis not present

## 2015-12-30 DIAGNOSIS — M461 Sacroiliitis, not elsewhere classified: Secondary | ICD-10-CM | POA: Diagnosis not present

## 2015-12-30 DIAGNOSIS — M791 Myalgia: Secondary | ICD-10-CM | POA: Diagnosis not present

## 2015-12-30 DIAGNOSIS — M5030 Other cervical disc degeneration, unspecified cervical region: Secondary | ICD-10-CM

## 2015-12-30 DIAGNOSIS — C61 Malignant neoplasm of prostate: Secondary | ICD-10-CM | POA: Diagnosis not present

## 2015-12-30 DIAGNOSIS — M47816 Spondylosis without myelopathy or radiculopathy, lumbar region: Secondary | ICD-10-CM

## 2015-12-30 DIAGNOSIS — M51369 Other intervertebral disc degeneration, lumbar region without mention of lumbar back pain or lower extremity pain: Secondary | ICD-10-CM

## 2015-12-30 MED ORDER — HYDROCODONE-ACETAMINOPHEN 10-325 MG PO TABS: ORAL_TABLET | ORAL | Status: DC

## 2015-12-30 NOTE — Progress Notes (Signed)
Safety precautions to be maintained throughout the outpatient stay will include: orient to surroundings, keep bed in low position, maintain call bell within reach at all times, provide assistance with transfer out of bed and ambulation.  

## 2015-12-30 NOTE — Patient Instructions (Addendum)
PLAN   Continue present medication: Hydrocodone acetaminophen  Lumbar epidural steroid injection to be performed at time return appointment.  F/U PCP Dr. Lisette Grinder III for evaliation of  BP and general medical  condition  F/U surgical evaluation. May consider pending follow-up evaluations  F/U neurological evaluation. May consider PNCV/EMG studies and other studies pending follow-up evaluations  May consider radiofrequency rhizolysis or intraspinal procedures pending response to present treatment and F/U evaluation   Patient to call Pain Management Center should patient have concerns prior to scheduled return appointment.Pain Management Discharge Instructions  General Discharge Instructions :  If you need to reach your doctor call: Monday-Friday 8:00 am - 4:00 pm at [phone] or toll free [phone].  After clinic hours [phone] to have operator reach doctor.  Bring all of your medication bottles to all your appointments in the pain clinic.  To cancel or reschedule your appointment with Pain Management please remember to call 24 hours in advance to avoid a fee.  Refer to the educational materials which you have been given on: General Risks, I had my Procedure. Discharge Instructions, Post Sedation.  Post Procedure Instructions:  The drugs you were given will stay in your system until tomorrow, so for the next 24 hours you should not drive, make any legal decisions or drink any alcoholic beverages.  You may eat anything you prefer, but it is better to start with liquids then soups and crackers, and gradually work up to solid foods.  Please notify your doctor immediately if you have any unusual bleeding, trouble breathing or pain that is not related to your normal pain.  Depending on the type of procedure that was done, some parts of your body may feel week and/or numb.  This usually clears up by tonight or the next day.  Walk with the use of an assistive device or  accompanied by an adult for the 24 hours.  You may use ice on the affected area for the first 24 hours.  Put ice in a Ziploc bag and cover with a towel and place against area 15 minutes on 15 minutes off.  You may switch to heat after 24 hours.GENERAL RISKS AND COMPLICATIONS  What are the risk, side effects and possible complications? Generally speaking, most procedures are safe.  However, with any procedure there are risks, side effects, and the possibility of complications.  The risks and complications are dependent upon the sites that are lesioned, or the type of nerve block to be performed.  The closer the procedure is to the spine, the more serious the risks are.  Great care is taken when placing the radio frequency needles, block needles or lesioning probes, but sometimes complications can occur.  Infection: Any time there is an injection through the skin, there is a risk of infection.  This is why sterile conditions are used for these blocks.  There are four possible types of infection.  Localized skin infection.  Central Nervous System Infection-This can be in the form of Meningitis, which can be deadly.  Epidural Infections-This can be in the form of an epidural abscess, which can cause pressure inside of the spine, causing compression of the spinal cord with subsequent paralysis. This would require an emergency surgery to decompress, and there are no guarantees that the patient would recover from the paralysis.  Discitis-This is an infection of the intervertebral discs.  It occurs in about 1% of discography procedures.  It is difficult to treat and it may lead to surgery.  2. Pain: the needles have to go through skin and soft tissues, will cause soreness.       3. Damage to internal structures:  The nerves to be lesioned may be near blood vessels or    other nerves which can be potentially damaged.       4. Bleeding: Bleeding is more common if the patient is taking blood thinners  such as  aspirin, Coumadin, Ticiid, Plavix, etc., or if he/she have some genetic predisposition  such as hemophilia. Bleeding into the spinal canal can cause compression of the spinal  cord with subsequent paralysis.  This would require an emergency surgery to  decompress and there are no guarantees that the patient would recover from the  paralysis.       5. Pneumothorax:  Puncturing of a lung is a possibility, every time a needle is introduced in  the area of the chest or upper back.  Pneumothorax refers to free air around the  collapsed lung(s), inside of the thoracic cavity (chest cavity).  Another two possible  complications related to a similar event would include: Hemothorax and Chylothorax.   These are variations of the Pneumothorax, where instead of air around the collapsed  lung(s), you may have blood or chyle, respectively.       6. Spinal headaches: They may occur with any procedures in the area of the spine.       7. Persistent CSF (Cerebro-Spinal Fluid) leakage: This is a rare problem, but may occur  with prolonged intrathecal or epidural catheters either due to the formation of a fistulous  track or a dural tear.       8. Nerve damage: By working so close to the spinal cord, there is always a possibility of  nerve damage, which could be as serious as a permanent spinal cord injury with  paralysis.       9. Death:  Although rare, severe deadly allergic reactions known as "Anaphylactic  reaction" can occur to any of the medications used.      10. Worsening of the symptoms:  We can always make thing worse.  What are the chances of something like this happening? Chances of any of this occuring are extremely low.  By statistics, you have more of a chance of getting killed in a motor vehicle accident: while driving to the hospital than any of the above occurring .  Nevertheless, you should be aware that they are possibilities.  In general, it is similar to taking a shower.  Everybody knows that you  can slip, hit your head and get killed.  Does that mean that you should not shower again?  Nevertheless always keep in mind that statistics do not mean anything if you happen to be on the wrong side of them.  Even if a procedure has a 1 (one) in a 1,000,000 (million) chance of going wrong, it you happen to be that one..Also, keep in mind that by statistics, you have more of a chance of having something go wrong when taking medications.  Who should not have this procedure? If you are on a blood thinning medication (e.g. Coumadin, Plavix, see list of "Blood Thinners"), or if you have an active infection going on, you should not have the procedure.  If you are taking any blood thinners, please inform your physician.  How should I prepare for this procedure?  Do not eat or drink anything at least six hours prior to the procedure.  Bring a driver  with you .  It cannot be a taxi.  Come accompanied by an adult that can drive you back, and that is strong enough to help you if your legs get weak or numb from the local anesthetic.  Take all of your medicines the morning of the procedure with just enough water to swallow them.  If you have diabetes, make sure that you are scheduled to have your procedure done first thing in the morning, whenever possible.  If you have diabetes, take only half of your insulin dose and notify our nurse that you have done so as soon as you arrive at the clinic.  If you are diabetic, but only take blood sugar pills (oral hypoglycemic), then do not take them on the morning of your procedure.  You may take them after you have had the procedure.  Do not take aspirin or any aspirin-containing medications, at least eleven (11) days prior to the procedure.  They may prolong bleeding.  Wear loose fitting clothing that may be easy to take off and that you would not mind if it got stained with Betadine or blood.  Do not wear any jewelry or perfume  Remove any nail coloring.  It  will interfere with some of our monitoring equipment.  NOTE: Remember that this is not meant to be interpreted as a complete list of all possible complications.  Unforeseen problems may occur.  BLOOD THINNERS The following drugs contain aspirin or other products, which can cause increased bleeding during surgery and should not be taken for 2 weeks prior to and 1 week after surgery.  If you should need take something for relief of minor pain, you may take acetaminophen which is found in Tylenol,m Datril, Anacin-3 and Panadol. It is not blood thinner. The products listed below are.  Do not take any of the products listed below in addition to any listed on your instruction sheet.  A.P.C or A.P.C with Codeine Codeine Phosphate Capsules #3 Ibuprofen Ridaura  ABC compound Congesprin Imuran rimadil  Advil Cope Indocin Robaxisal  Alka-Seltzer Effervescent Pain Reliever and Antacid Coricidin or Coricidin-D  Indomethacin Rufen  Alka-Seltzer plus Cold Medicine Cosprin Ketoprofen S-A-C Tablets  Anacin Analgesic Tablets or Capsules Coumadin Korlgesic Salflex  Anacin Extra Strength Analgesic tablets or capsules CP-2 Tablets Lanoril Salicylate  Anaprox Cuprimine Capsules Levenox Salocol  Anexsia-D Dalteparin Magan Salsalate  Anodynos Darvon compound Magnesium Salicylate Sine-off  Ansaid Dasin Capsules Magsal Sodium Salicylate  Anturane Depen Capsules Marnal Soma  APF Arthritis pain formula Dewitt's Pills Measurin Stanback  Argesic Dia-Gesic Meclofenamic Sulfinpyrazone  Arthritis Bayer Timed Release Aspirin Diclofenac Meclomen Sulindac  Arthritis pain formula Anacin Dicumarol Medipren Supac  Analgesic (Safety coated) Arthralgen Diffunasal Mefanamic Suprofen  Arthritis Strength Bufferin Dihydrocodeine Mepro Compound Suprol  Arthropan liquid Dopirydamole Methcarbomol with Aspirin Synalgos  ASA tablets/Enseals Disalcid Micrainin Tagament  Ascriptin Doan's Midol Talwin  Ascriptin A/D Dolene Mobidin Tanderil   Ascriptin Extra Strength Dolobid Moblgesic Ticlid  Ascriptin with Codeine Doloprin or Doloprin with Codeine Momentum Tolectin  Asperbuf Duoprin Mono-gesic Trendar  Aspergum Duradyne Motrin or Motrin IB Triminicin  Aspirin plain, buffered or enteric coated Durasal Myochrisine Trigesic  Aspirin Suppositories Easprin Nalfon Trillsate  Aspirin with Codeine Ecotrin Regular or Extra Strength Naprosyn Uracel  Atromid-S Efficin Naproxen Ursinus  Auranofin Capsules Elmiron Neocylate Vanquish  Axotal Emagrin Norgesic Verin  Azathioprine Empirin or Empirin with Codeine Normiflo Vitamin E  Azolid Emprazil Nuprin Voltaren  Bayer Aspirin plain, buffered or children's or timed BC Tablets or powders  Encaprin Orgaran Warfarin Sodium  Buff-a-Comp Enoxaparin Orudis Zorpin  Buff-a-Comp with Codeine Equegesic Os-Cal-Gesic   Buffaprin Excedrin plain, buffered or Extra Strength Oxalid   Bufferin Arthritis Strength Feldene Oxphenbutazone   Bufferin plain or Extra Strength Feldene Capsules Oxycodone with Aspirin   Bufferin with Codeine Fenoprofen Fenoprofen Pabalate or Pabalate-SF   Buffets II Flogesic Panagesic   Buffinol plain or Extra Strength Florinal or Florinal with Codeine Panwarfarin   Buf-Tabs Flurbiprofen Penicillamine   Butalbital Compound Four-way cold tablets Penicillin   Butazolidin Fragmin Pepto-Bismol   Carbenicillin Geminisyn Percodan   Carna Arthritis Reliever Geopen Persantine   Carprofen Gold's salt Persistin   Chloramphenicol Goody's Phenylbutazone   Chloromycetin Haltrain Piroxlcam   Clmetidine heparin Plaquenil   Cllnoril Hyco-pap Ponstel   Clofibrate Hydroxy chloroquine Propoxyphen         Before stopping any of these medications, be sure to consult the physician who ordered them.  Some, such as Coumadin (Warfarin) are ordered to prevent or treat serious conditions such as "deep thrombosis", "pumonary embolisms", and other heart problems.  The amount of time that you may need off  of the medication may also vary with the medication and the reason for which you were taking it.  If you are taking any of these medications, please make sure you notify your pain physician before you undergo any procedures.         Epidural Steroid Injection An epidural steroid injection is given to relieve pain in your neck, back, or legs that is caused by the irritation or swelling of a nerve root. This procedure involves injecting a steroid and numbing medicine (anesthetic) into the epidural space. The epidural space is the space between the outer covering of your spinal cord and the bones that form your backbone (vertebra).  LET [facility] CARE PROVIDER KNOW ABOUT:   Any allergies you have.  All medicines you are taking, including vitamins, herbs, eye drops, creams, and over-the-counter medicines such as aspirin.  Previous problems you or members of your family have had with the use of anesthetics.  Any blood disorders or blood clotting disorders you have.  Previous surgeries you have had.  Medical conditions you have. RISKS AND COMPLICATIONS Generally, this is a safe procedure. However, as with any procedure, complications can occur. Possible complications of epidural steroid injection include:  Headache.  Bleeding.  Infection.  Allergic reaction to the medicines.  Damage to your nerves. The response to this procedure depends on the underlying cause of the pain and its duration. People who have long-term (chronic) pain are less likely to benefit from epidural steroids than are those people whose pain comes on strong and suddenly. BEFORE THE PROCEDURE   Ask your health care provider about changing or stopping your regular medicines. You may be advised to stop taking blood-thinning medicines a few days before the procedure.  You may be given medicines to reduce anxiety.  Arrange for someone to take you home after the procedure. PROCEDURE   You will remain awake  during the procedure. You may receive medicine to make you relaxed.  You will be asked to lie on your stomach.  The injection site will be cleaned.  The injection site will be numbed with a medicine (local anesthetic).  A needle will be injected through your skin into the epidural space.  Your health care provider will use an X-ray machine to ensure that the steroid is delivered closest to the affected nerve. You may have minimal discomfort at this  time.  Once the needle is in the right position, the local anesthetic and the steroid will be injected into the epidural space.  The needle will then be removed and a bandage will be applied to the injection site. AFTER THE PROCEDURE   You may be monitored for a short time before you go home.  You may feel weakness or numbness in your arm or leg, which disappears within hours.  You may be allowed to eat, drink, and take your regular medicine.  You may have soreness at the site of the injection.   This information is not intended to replace advice given to you by your health care provider. Make sure you discuss any questions you have with your health care provider.   Document Released: 11/30/2007 Document Revised: 04/25/2013 Document Reviewed: 02/09/2013 Elsevier Interactive Patient Education [employer].

## 2015-12-30 NOTE — Progress Notes (Signed)
   Subjective:    Patient ID: Martin Golden, male    DOB: [birth year], 65 y.o.   MRN: [id number]  HPI  The patient is a 65 year old gentleman who returns to pain management for further evaluation and treatment of pain involving the region of the lower back and lower extremity regions the patient has had significant improvement of his pain with prior treatment in pain management center. The patient states that the pain does increased with standing and walking and patient admits to some weakness after standing and walking for. of time. There is concern regarding component of pain due to lumbar stenosis with neurogenic claudication and we will consider patient for lumbar epidural steroid injection at time of return appointment. The patient will continue hydrocodone acetaminophen at this time and we will consider additional modifications of treatment regimen pending follow-up evaluation. All agreed to suggested treatment plan      Review of Systems     Objective:   Physical Exam   There was tends to palpation of the splenius capitis and occipitalis musculature region a mild degree with mild tenderness of the cervical facet cervical paraspinal musculature region. Palpation over the thoracic facet thoracic paraspinal must reason was with moderate tends to palpation of the thoracic paraspinal musculature region with no crepitus of the thoracic region noted. Palpation of the acromioclavicular and glenohumeral joint regions reproduce mild to moderate discomfort with patient being with unremarkable Spurling's maneuver and appeared to be with bilaterally equal grip strength. Tinel and Phalen's maneuver were without increase of pain of significant degree. Palpation over the lumbar paraspinal must reason lumbar facet region was attends to palpation of moderate degree with lateral bending rotation extension and palpation of the lumbar facets reproducing moderate discomfort. There was tenderness over the PSIS and  PII S region a moderate degree. There was no significance to palpation of the greater trochanteric region iliotibial band region. Palpation over the greater trochanteric region iliotibial band region was with mild discomfort. Straight leg raise was tolerates approximately 20 with questionably increased pain with dorsiflexion noted. The knees were attends to palpation  EHL strength appeared to be decreased There was no sensory deficit or dermatomal distribution of the lower extremity is noted. There was negative clonus negative Homans. Abdomen was nontender with no costovertebral tenderness noted     Assessment & Plan:     Degenerative disc disease lumbar spine Lumbar spondylosis with degenerative disc disease causing multilevel central foraminal and subarticular lateral recess stenosis  Lumbar stenosis with neurogenic claudication  Lumbar facet syndrome  Lumbar radiculopathy  History of right renal cyst with suspected left renal atrophy  Carcinoma of prostate  Sacroiliac joint dysfunction     PLAN   Continue present medication: Hydrocodone acetaminophen  Lumbar epidural steroid injection to be performed at time return appointment.  F/U PCP Dr. Lisette Grinder III for evaliation of  BP and general medical  condition  F/U surgical evaluation. May consider pending follow-up evaluations  F/U neurological evaluation. May consider PNCV/EMG studies and other studies pending follow-up evaluations  May consider radiofrequency rhizolysis or intraspinal procedures pending response to present treatment and F/U evaluation   Patient to call Pain Management Center should patient have concerns prior to scheduled return appointment.

## 2016-01-06 LAB — TOXASSURE SELECT 13 (MW), URINE: PDF: 0

## 2016-01-15 DIAGNOSIS — E663 Overweight: Secondary | ICD-10-CM | POA: Diagnosis not present

## 2016-01-15 DIAGNOSIS — Z6838 Body mass index (BMI) 38.0-38.9, adult: Secondary | ICD-10-CM | POA: Diagnosis not present

## 2016-01-15 DIAGNOSIS — R635 Abnormal weight gain: Secondary | ICD-10-CM | POA: Diagnosis not present

## 2016-01-15 DIAGNOSIS — I10 Essential (primary) hypertension: Secondary | ICD-10-CM | POA: Diagnosis not present

## 2016-01-21 ENCOUNTER — Other Ambulatory Visit: Payer: Self-pay | Admitting: Pain Medicine

## 2016-01-21 ENCOUNTER — Ambulatory Visit: Payer: PRIVATE HEALTH INSURANCE | Admitting: Pain Medicine

## 2016-01-27 ENCOUNTER — Ambulatory Visit: Payer: Medicare Other | Attending: Pain Medicine | Admitting: Pain Medicine

## 2016-01-27 ENCOUNTER — Encounter: Payer: Self-pay | Admitting: Pain Medicine

## 2016-01-27 VITALS — BP 128/77 | HR 71 | Temp 98.1°F | Resp 16 | Ht 73.0 in | Wt 270.0 lb

## 2016-01-27 DIAGNOSIS — M51369 Other intervertebral disc degeneration, lumbar region without mention of lumbar back pain or lower extremity pain: Secondary | ICD-10-CM

## 2016-01-27 DIAGNOSIS — M533 Sacrococcygeal disorders, not elsewhere classified: Secondary | ICD-10-CM | POA: Diagnosis not present

## 2016-01-27 DIAGNOSIS — M5116 Intervertebral disc disorders with radiculopathy, lumbar region: Secondary | ICD-10-CM | POA: Insufficient documentation

## 2016-01-27 DIAGNOSIS — M4806 Spinal stenosis, lumbar region: Secondary | ICD-10-CM | POA: Insufficient documentation

## 2016-01-27 DIAGNOSIS — M5416 Radiculopathy, lumbar region: Secondary | ICD-10-CM | POA: Diagnosis not present

## 2016-01-27 DIAGNOSIS — M47816 Spondylosis without myelopathy or radiculopathy, lumbar region: Secondary | ICD-10-CM

## 2016-01-27 DIAGNOSIS — M48061 Spinal stenosis, lumbar region without neurogenic claudication: Secondary | ICD-10-CM

## 2016-01-27 DIAGNOSIS — C61 Malignant neoplasm of prostate: Secondary | ICD-10-CM | POA: Diagnosis not present

## 2016-01-27 DIAGNOSIS — M47812 Spondylosis without myelopathy or radiculopathy, cervical region: Secondary | ICD-10-CM

## 2016-01-27 DIAGNOSIS — M48062 Spinal stenosis, lumbar region with neurogenic claudication: Secondary | ICD-10-CM

## 2016-01-27 DIAGNOSIS — M791 Myalgia: Secondary | ICD-10-CM | POA: Diagnosis not present

## 2016-01-27 DIAGNOSIS — M79606 Pain in leg, unspecified: Secondary | ICD-10-CM | POA: Diagnosis present

## 2016-01-27 DIAGNOSIS — M5030 Other cervical disc degeneration, unspecified cervical region: Secondary | ICD-10-CM

## 2016-01-27 DIAGNOSIS — N281 Cyst of kidney, acquired: Secondary | ICD-10-CM | POA: Insufficient documentation

## 2016-01-27 DIAGNOSIS — M545 Low back pain: Secondary | ICD-10-CM | POA: Diagnosis present

## 2016-01-27 DIAGNOSIS — M461 Sacroiliitis, not elsewhere classified: Secondary | ICD-10-CM | POA: Diagnosis not present

## 2016-01-27 DIAGNOSIS — M47817 Spondylosis without myelopathy or radiculopathy, lumbosacral region: Secondary | ICD-10-CM | POA: Diagnosis not present

## 2016-01-27 DIAGNOSIS — Z9889 Other specified postprocedural states: Secondary | ICD-10-CM

## 2016-01-27 DIAGNOSIS — M5136 Other intervertebral disc degeneration, lumbar region: Secondary | ICD-10-CM

## 2016-01-27 MED ORDER — HYDROCODONE-ACETAMINOPHEN 10-325 MG PO TABS: ORAL_TABLET | ORAL | Status: DC

## 2016-01-27 NOTE — Progress Notes (Signed)
   Subjective:    Patient ID: Martin Golden, male    DOB: 08/25/1951, 65 y.o.   MRN: [id number]  HPI  The patient is a 65 year old gentleman who returns to pain management for further evaluation and treatment of pain involving the lumbar and lower extremity region. The patient states the pain is aggravated with prolonged standing and walking the pain associated with significant pain and with weakness of the patient stands of water before any significant period of time. On today's visit we reviewed patient's lumbar MRI and patient is with evidence of significant stenosis of the lumbar region. We have discussed lumbar epidural steroid injection and other treatment including neurosurgical evaluation of patient. The patient prefers to avoid both interventional treatment as well as neurosurgical evaluation at this time. The patient wishes to proceed with physical therapy. We prescribed physical therapy patient on today's visit with caution to avoid aggravation of symptomatology. The patient will continue hydrocodone acetaminophen as prescribed and will call pain management should they be change in condition prior to scheduled return appointment. All agreed to suggested treatment plan.  Review of Systems     Objective:   Physical Exam  There was tenderness of the spinous M occipitalis muscular region a mild degree with mild tenderness of the cervical facet cervical paraspinal musculature region as well as mild tenderness of the acromioclavicular and glenohumeral joint region. There was limited range of motion of the shoulder with patient being with slightly decreased grip strength. Tinel and Phalen's maneuver were without increase of pain of significant degree. Palpation over the thoracic region thoracic facet region was with moderate tenderness to palpation of the lower thoracic paraspinal musculature region with no crepitus of the thoracic region noted. Palpation over the region of the lumbar paraspinal  must reason lumbar facet region was attends to palpation with lateral bending rotation extension and palpation of the lumbar facets reproducing moderate discomfort. DTRs were difficult to elicit. EHL strength appeared to be decreased. There was no definite sensory deficit or dermatomal distribution detected. There was negative clonus negative Homans. Abdomen nontender with no costovertebral tenderness noted.      Assessment & Plan:      Degenerative disc disease lumbar spine Lumbar spondylosis with degenerative disc disease causing multilevel central foraminal and subarticular lateral recess stenosis  Lumbar stenosis with neurogenic claudication  Lumbar facet syndrome  Lumbar radiculopathy  History of right renal cyst with suspected left renal atrophy  Carcinoma of prostate  Sacroiliac joint dysfunction     PLAN   Continue present medication: Hydrocodone acetaminophen  Will proceed with lumbar epidural steroid injection if patient decides to proceed with the procedure  F/U PCP Dr. Lisette Grinder III for evaliation of  BP and general medical  condition  F/U surgical evaluation. Please ask the secretaries and nurses the date of your neurosurgical evaluation of lower back and lower extremity pain paresthesias and weakness. Patient wishes to avoid surgical evaluation at this time  F/U neurological evaluation. May consider PNCV/EMG studies and other studies pending follow-up evaluations  Physical therapy as discussed. Please be cautious not to aggravate symptoms at physical therapy. Patient with significant spinal stenosis choose to avoid surgical evaluation and interventional treatment at this time and wishes to proceed with physical therapy  May consider radiofrequency rhizolysis or intraspinal procedures pending response to present treatment and F/U evaluation   Patient to call Pain Management Center should patient have concerns prior to scheduled return appointment.

## 2016-01-27 NOTE — Progress Notes (Signed)
Safety precautions to be maintained throughout the outpatient stay will include: orient to surroundings, keep bed in low position, maintain call bell within reach at all times, provide assistance with transfer out of bed and ambulation.  

## 2016-01-27 NOTE — Patient Instructions (Addendum)
PLAN   Continue present medication: Hydrocodone acetaminophen  Will proceed with lumbar epidural steroid injection if patient decides to proceed with the procedure  F/U PCP Dr. Lisette Grinder III for evaliation of  BP and general medical  condition  F/U surgical evaluation. Please ask the secretaries and nurses the date of your neurosurgical evaluation of lower back and lower extremity pain paresthesias and weakness. Patient wishes to avoid surgical evaluation at this time  F/U neurological evaluation. May consider PNCV/EMG studies and other studies pending follow-up evaluations  Physical therapy as discussed. Please be cautious not to aggravate symptoms at physical therapy  May consider radiofrequency rhizolysis or intraspinal procedures pending response to present treatment and F/U evaluation   Patient to call Pain Management Center should patient have concerns prior to scheduled return appointment.

## 2016-02-13 DIAGNOSIS — Z6837 Body mass index (BMI) 37.0-37.9, adult: Secondary | ICD-10-CM | POA: Diagnosis not present

## 2016-02-13 DIAGNOSIS — R635 Abnormal weight gain: Secondary | ICD-10-CM | POA: Diagnosis not present

## 2016-02-17 ENCOUNTER — Encounter: Payer: Self-pay | Admitting: Pain Medicine

## 2016-02-17 ENCOUNTER — Ambulatory Visit: Payer: Medicare Other | Attending: Pain Medicine | Admitting: Pain Medicine

## 2016-02-17 VITALS — BP 117/70 | HR 84 | Temp 98.0°F | Resp 16 | Ht 73.0 in | Wt 260.0 lb

## 2016-02-17 DIAGNOSIS — M5136 Other intervertebral disc degeneration, lumbar region: Secondary | ICD-10-CM

## 2016-02-17 DIAGNOSIS — M5416 Radiculopathy, lumbar region: Secondary | ICD-10-CM

## 2016-02-17 DIAGNOSIS — M791 Myalgia: Secondary | ICD-10-CM | POA: Diagnosis not present

## 2016-02-17 DIAGNOSIS — C61 Malignant neoplasm of prostate: Secondary | ICD-10-CM | POA: Diagnosis not present

## 2016-02-17 DIAGNOSIS — M79606 Pain in leg, unspecified: Secondary | ICD-10-CM | POA: Diagnosis present

## 2016-02-17 DIAGNOSIS — M5030 Other cervical disc degeneration, unspecified cervical region: Secondary | ICD-10-CM

## 2016-02-17 DIAGNOSIS — M47812 Spondylosis without myelopathy or radiculopathy, cervical region: Secondary | ICD-10-CM

## 2016-02-17 DIAGNOSIS — M48062 Spinal stenosis, lumbar region with neurogenic claudication: Secondary | ICD-10-CM

## 2016-02-17 DIAGNOSIS — M47896 Other spondylosis, lumbar region: Secondary | ICD-10-CM | POA: Diagnosis not present

## 2016-02-17 DIAGNOSIS — M5116 Intervertebral disc disorders with radiculopathy, lumbar region: Secondary | ICD-10-CM | POA: Diagnosis not present

## 2016-02-17 DIAGNOSIS — M533 Sacrococcygeal disorders, not elsewhere classified: Secondary | ICD-10-CM | POA: Insufficient documentation

## 2016-02-17 DIAGNOSIS — M461 Sacroiliitis, not elsewhere classified: Secondary | ICD-10-CM | POA: Diagnosis not present

## 2016-02-17 DIAGNOSIS — M4806 Spinal stenosis, lumbar region: Secondary | ICD-10-CM | POA: Insufficient documentation

## 2016-02-17 DIAGNOSIS — Z9889 Other specified postprocedural states: Secondary | ICD-10-CM

## 2016-02-17 DIAGNOSIS — M48061 Spinal stenosis, lumbar region without neurogenic claudication: Secondary | ICD-10-CM

## 2016-02-17 DIAGNOSIS — M47816 Spondylosis without myelopathy or radiculopathy, lumbar region: Secondary | ICD-10-CM

## 2016-02-17 DIAGNOSIS — M47817 Spondylosis without myelopathy or radiculopathy, lumbosacral region: Secondary | ICD-10-CM | POA: Diagnosis not present

## 2016-02-17 DIAGNOSIS — M545 Low back pain: Secondary | ICD-10-CM | POA: Diagnosis present

## 2016-02-17 MED ORDER — HYDROCODONE-ACETAMINOPHEN 10-325 MG PO TABS: ORAL_TABLET | ORAL | Status: DC

## 2016-02-17 NOTE — Progress Notes (Signed)
Safety precautions to be maintained throughout the outpatient stay will include: orient to surroundings, keep bed in low position, maintain call bell within reach at all times, provide assistance with transfer out of bed and ambulation.  

## 2016-02-17 NOTE — Patient Instructions (Addendum)
PLAN   Continue present medication: Hydrocodone acetaminophen  F/U PCP Dr. Lisette Grinder III for evaliation of  BP and general medical  condition  F/U surgical evaluation. Please ask the secretaries and nurses the date of your neurosurgical evaluation of lower back and lower extremity pain paresthesias and weakness. Patient wishes to avoid surgical evaluation at this time  F/U neurological evaluation. May consider PNCV/EMG studies and other studies pending follow-up evaluations  Physical therapy and yoga  as discussed. Please be cautious not to aggravate symptoms at physical therapy  May consider radiofrequency rhizolysis or intraspinal procedures pending response to present treatment and F/U evaluation   Patient to call Pain Management Center should patient have concerns prior to scheduled return appointment.Pain Management Discharge Instructions  General Discharge Instructions :  If you need to reach your doctor call: Monday-Friday 8:00 am - 4:00 pm at [phone] or toll free [phone].  After clinic hours [phone] to have operator reach doctor.  Bring all of your medication bottles to all your appointments in the pain clinic.  To cancel or reschedule your appointment with Pain Management please remember to call 24 hours in advance to avoid a fee.  Refer to the educational materials which you have been given on: General Risks, I had my Procedure. Discharge Instructions, Post Sedation.  Post Procedure Instructions:  The drugs you were given will stay in your system until tomorrow, so for the next 24 hours you should not drive, make any legal decisions or drink any alcoholic beverages.  You may eat anything you prefer, but it is better to start with liquids then soups and crackers, and gradually work up to solid foods.  Please notify your doctor immediately if you have any unusual bleeding, trouble breathing or pain that is not related to your normal pain.  Depending on  the type of procedure that was done, some parts of your body may feel week and/or numb.  This usually clears up by tonight or the next day.  Walk with the use of an assistive device or accompanied by an adult for the 24 hours.  You may use ice on the affected area for the first 24 hours.  Put ice in a Ziploc bag and cover with a towel and place against area 15 minutes on 15 minutes off.  You may switch to heat after 24 hours.

## 2016-02-17 NOTE — Progress Notes (Signed)
   Subjective:    Patient ID: Martin Golden, male    DOB: [birth year], 65 y.o.   MRN: [id number]  HPI  The patient is a 65 year old gentleman who returns to pain management for further evaluation and treatment of pain involving the region of the lower back and lower extremity region predominantly. The patient states that he is tolerating hydrocodone acetaminophen without undesirable side effects. The patient continues to have lower back and lower extremity pain and has been involved in yoga. We discussed patient's condition and patient will continue local and other treatment including physical therapy exercises as discussed. We will remain available to consider patient for interventional treatment as well as additional modifications of treatment regimen pending response to the present and. The patient denied any trauma change in events of daily living the call significant change in symptomatology. All agreed to suggested treatment plan.  Review of Systems     Objective:   Physical Exam  There was mild tenderness of the splenius capitis and occipitalis regions palpation which reproduces minimal discomfort. Palpation of the acromioclavicular and glenohumeral joint regions reproduce mild discomfort. The patient was with unremarkable Spurling's maneuver and was with mild difficulty performing drop test. Palpation over the cervical facet cervical paraspinal musculature region and the thoracic facet thoracic paraspinal musculature region reproduced mild to moderate discomfort. There was no crepitus of the thoracic region noted. Palpation over the lumbar paraspinal musculatures and lumbar facet region was associated with moderate discomfort. Lateral bending rotation extension and palpation of the lumbar facets reproduce moderate discomfort on the left as well as on the right. Straight leg raising was tolerates approximately 20 without increased pain with dorsiflexion noted. DTRs appeared to be 1+ at the knees.  EHL strength appeared to be slightly decreased. No definite sensory deficit of dermatomal distribution detected. Palpation over the PSIS and PII S region reproduced mild to moderate discomfort. There appeared to be negative clonus negative Homans.. Abdomen was nontender and no costovertebral tenderness was noted.      Assessment & Plan:      Degenerative disc disease lumbar spine Lumbar spondylosis with degenerative disc disease causing multilevel central foraminal and subarticular lateral recess stenosis  Lumbar stenosis with neurogenic claudication  Lumbar facet syndrome  Lumbar radiculopathy  History of right renal cyst with suspected left renal atrophy  Carcinoma of prostate  Sacroiliac joint dysfunction      PLAN   Continue present medication: Hydrocodone acetaminophen  F/U PCP Dr. Lisette Grinder III for evaliation of  BP and general medical  condition  F/U surgical evaluation. We discussed the date of  neurosurgical evaluation of lower back and lower extremity pain paresthesias and weakness. Patient wishes to avoid surgical evaluation at this time  F/U neurological evaluation. May consider PNCV/EMG studies and other studies pending follow-up evaluations  Physical therapy and yoga  as discussed. Please be cautious not to aggravate symptoms at physical therapy  May consider radiofrequency rhizolysis or intraspinal procedures pending response to present treatment and F/U evaluation   Patient to call Pain Management Center should patient have concerns prior to scheduled return appointment.

## 2016-02-18 ENCOUNTER — Ambulatory Visit: Payer: PRIVATE HEALTH INSURANCE | Admitting: Pain Medicine

## 2016-03-11 DIAGNOSIS — M519 Unspecified thoracic, thoracolumbar and lumbosacral intervertebral disc disorder: Secondary | ICD-10-CM | POA: Diagnosis not present

## 2016-03-11 DIAGNOSIS — F410 Panic disorder [episodic paroxysmal anxiety] without agoraphobia: Secondary | ICD-10-CM | POA: Diagnosis not present

## 2016-03-11 DIAGNOSIS — I10 Essential (primary) hypertension: Secondary | ICD-10-CM | POA: Diagnosis not present

## 2016-03-11 DIAGNOSIS — E7800 Pure hypercholesterolemia, unspecified: Secondary | ICD-10-CM | POA: Diagnosis not present

## 2016-03-15 DIAGNOSIS — Z6837 Body mass index (BMI) 37.0-37.9, adult: Secondary | ICD-10-CM | POA: Diagnosis not present

## 2016-03-15 DIAGNOSIS — R635 Abnormal weight gain: Secondary | ICD-10-CM | POA: Diagnosis not present

## 2016-03-18 ENCOUNTER — Ambulatory Visit: Payer: PRIVATE HEALTH INSURANCE | Admitting: Pain Medicine

## 2016-03-22 ENCOUNTER — Ambulatory Visit: Payer: Medicare Other | Attending: Pain Medicine | Admitting: Pain Medicine

## 2016-03-22 ENCOUNTER — Encounter: Payer: Self-pay | Admitting: Pain Medicine

## 2016-03-22 VITALS — BP 133/81 | HR 98 | Temp 97.5°F | Resp 16 | Ht 73.0 in | Wt 270.0 lb

## 2016-03-22 DIAGNOSIS — M5416 Radiculopathy, lumbar region: Secondary | ICD-10-CM

## 2016-03-22 DIAGNOSIS — M5136 Other intervertebral disc degeneration, lumbar region: Secondary | ICD-10-CM | POA: Diagnosis not present

## 2016-03-22 DIAGNOSIS — Z9889 Other specified postprocedural states: Secondary | ICD-10-CM

## 2016-03-22 DIAGNOSIS — M545 Low back pain: Secondary | ICD-10-CM | POA: Diagnosis present

## 2016-03-22 DIAGNOSIS — M47817 Spondylosis without myelopathy or radiculopathy, lumbosacral region: Secondary | ICD-10-CM | POA: Diagnosis not present

## 2016-03-22 DIAGNOSIS — M5030 Other cervical disc degeneration, unspecified cervical region: Secondary | ICD-10-CM

## 2016-03-22 DIAGNOSIS — M533 Sacrococcygeal disorders, not elsewhere classified: Secondary | ICD-10-CM | POA: Diagnosis not present

## 2016-03-22 DIAGNOSIS — M4806 Spinal stenosis, lumbar region: Secondary | ICD-10-CM | POA: Diagnosis not present

## 2016-03-22 DIAGNOSIS — M47816 Spondylosis without myelopathy or radiculopathy, lumbar region: Secondary | ICD-10-CM

## 2016-03-22 DIAGNOSIS — C61 Malignant neoplasm of prostate: Secondary | ICD-10-CM | POA: Diagnosis not present

## 2016-03-22 DIAGNOSIS — M48061 Spinal stenosis, lumbar region without neurogenic claudication: Secondary | ICD-10-CM

## 2016-03-22 DIAGNOSIS — M5116 Intervertebral disc disorders with radiculopathy, lumbar region: Secondary | ICD-10-CM | POA: Diagnosis not present

## 2016-03-22 DIAGNOSIS — M4726 Other spondylosis with radiculopathy, lumbar region: Secondary | ICD-10-CM | POA: Insufficient documentation

## 2016-03-22 DIAGNOSIS — M48062 Spinal stenosis, lumbar region with neurogenic claudication: Secondary | ICD-10-CM

## 2016-03-22 DIAGNOSIS — M62830 Muscle spasm of back: Secondary | ICD-10-CM | POA: Insufficient documentation

## 2016-03-22 DIAGNOSIS — M461 Sacroiliitis, not elsewhere classified: Secondary | ICD-10-CM | POA: Diagnosis not present

## 2016-03-22 DIAGNOSIS — M79606 Pain in leg, unspecified: Secondary | ICD-10-CM | POA: Diagnosis present

## 2016-03-22 DIAGNOSIS — M51369 Other intervertebral disc degeneration, lumbar region without mention of lumbar back pain or lower extremity pain: Secondary | ICD-10-CM

## 2016-03-22 DIAGNOSIS — M47812 Spondylosis without myelopathy or radiculopathy, cervical region: Secondary | ICD-10-CM

## 2016-03-22 DIAGNOSIS — M791 Myalgia: Secondary | ICD-10-CM | POA: Diagnosis not present

## 2016-03-22 MED ORDER — HYDROCODONE-ACETAMINOPHEN 10-325 MG PO TABS: ORAL_TABLET | ORAL | Status: DC

## 2016-03-22 NOTE — Progress Notes (Signed)
   Subjective:    Patient ID: Martin Golden, male    DOB: Nov 05, 1950, 65 y.o.   MRN: [id number]  HPI  The patient is a 65 year old gentleman who returns to pain management for further evaluation and treatment of pain involving the lower back and lower extremity region predominantly the patient also is with pain involving the mid lower back region as well as the cervical region. We discussed patient's condition including patient's exercise program and patient has yet to begin physical therapy and yoga as planned. We will continue presently prescribed medications and will consider patient for treatment including interventional treatment should they be return appointment significant pain the patient was with understanding and agreed to suggested treatment plan. The patient admitted to increase of pain with standing walking and stated that pain did become more intense as the day progresses. The patient has been quite busy attending to family matters. We remain available to consider modifications of treatment pending follow-up evaluation as discussed. We will continue present medication hydrocodone acetaminophen. All agreed to suggested treatment plan     Review of Systems     Objective:   Physical Exam  There was tenderness of the splenius capitis and occipitalis region palpation which reproduces mild discomfort with mild tenderness over the cervical facet and thoracic facet thoracic paraspinal musculature region. There was mild tenderness of the acromioclavicular and glenohumeral joint region and patient appeared to be with unremarkable drop test. There was unremarkable Spurling's maneuver as well. Palpation over the thoracic region with no crepitus of the thoracic region noted. There was moderate muscle spasm of the lower thoracic paraspinal musculature region. Straight leg raising was tolerates approximately 30 without increased pain with dorsiflexion noted. EHL strength appeared to be slightly  decreased. There was negative clonus negative Homans. No sensory deficit or dermatomal distribution lower extremity is noted. There was negative clonus negative Homans. There was mild tenderness of the PSIS and PII S region as well as the greater trochanteric region iliotibial band region. Abdomen soft nontender with no costovertebral tenderness noted      Assessment & Plan:   Degenerative disc disease lumbar spine Lumbar spondylosis with degenerative disc disease causing multilevel central foraminal and subarticular lateral recess stenosis  Lumbar stenosis with neurogenic claudication  Lumbar facet syndrome  Lumbar radiculopathy  History of right renal cyst with suspected left renal atrophy  Carcinoma of prostate  Sacroiliac joint dysfunction     PLAN   Continue present medication: Hydrocodone acetaminophen  F/U PCP Dr. Lisette Grinder III for evaliation of  BP and general medical  condition  F/U surgical evaluation.  F/U neurological evaluation. May consider PNCV/EMG studies and other studies pending follow-up evaluations  Physical therapy and yoga  as discussed. Please be cautious not to aggravate symptoms at physical therapy  May consider radiofrequency rhizolysis or intraspinal procedures pending response to present treatment and F/U evaluation   Patient to call Pain Management Center should patient have concerns prior to scheduled return appointment.

## 2016-03-22 NOTE — Progress Notes (Signed)
Safety precautions to be maintained throughout the outpatient stay will include: orient to surroundings, keep bed in low position, maintain call bell within reach at all times, provide assistance with transfer out of bed and ambulation.  

## 2016-03-22 NOTE — Patient Instructions (Signed)
PLAN   Continue present medication: Hydrocodone acetaminophen  F/U PCP Dr. Lisette Grinder III for evaliation of  BP and general medical  condition  F/U surgical evaluation.  F/U neurological evaluation. May consider PNCV/EMG studies and other studies pending follow-up evaluations  Physical therapy and yoga  as discussed. Please be cautious not to aggravate symptoms at physical therapy  May consider radiofrequency rhizolysis or intraspinal procedures pending response to present treatment and F/U evaluation   Patient to call Pain Management Center should patient have concerns prior to scheduled return appointment.

## 2016-04-12 DIAGNOSIS — F411 Generalized anxiety disorder: Secondary | ICD-10-CM | POA: Diagnosis not present

## 2016-04-15 DIAGNOSIS — Z6838 Body mass index (BMI) 38.0-38.9, adult: Secondary | ICD-10-CM | POA: Diagnosis not present

## 2016-04-15 DIAGNOSIS — R635 Abnormal weight gain: Secondary | ICD-10-CM | POA: Diagnosis not present

## 2016-04-21 ENCOUNTER — Encounter: Payer: Self-pay | Admitting: Pain Medicine

## 2016-04-21 ENCOUNTER — Ambulatory Visit: Payer: Medicare Other | Attending: Pain Medicine | Admitting: Pain Medicine

## 2016-04-21 VITALS — BP 117/70 | HR 93 | Temp 98.0°F | Resp 16 | Ht 73.0 in | Wt 265.0 lb

## 2016-04-21 DIAGNOSIS — Z9889 Other specified postprocedural states: Secondary | ICD-10-CM

## 2016-04-21 DIAGNOSIS — M47812 Spondylosis without myelopathy or radiculopathy, cervical region: Secondary | ICD-10-CM

## 2016-04-21 DIAGNOSIS — M791 Myalgia: Secondary | ICD-10-CM | POA: Diagnosis not present

## 2016-04-21 DIAGNOSIS — M461 Sacroiliitis, not elsewhere classified: Secondary | ICD-10-CM | POA: Diagnosis not present

## 2016-04-21 DIAGNOSIS — M5416 Radiculopathy, lumbar region: Secondary | ICD-10-CM | POA: Diagnosis not present

## 2016-04-21 DIAGNOSIS — M48061 Spinal stenosis, lumbar region without neurogenic claudication: Secondary | ICD-10-CM

## 2016-04-21 DIAGNOSIS — M5136 Other intervertebral disc degeneration, lumbar region: Secondary | ICD-10-CM

## 2016-04-21 DIAGNOSIS — M47817 Spondylosis without myelopathy or radiculopathy, lumbosacral region: Secondary | ICD-10-CM | POA: Diagnosis not present

## 2016-04-21 DIAGNOSIS — M533 Sacrococcygeal disorders, not elsewhere classified: Secondary | ICD-10-CM

## 2016-04-21 DIAGNOSIS — M47816 Spondylosis without myelopathy or radiculopathy, lumbar region: Secondary | ICD-10-CM

## 2016-04-21 DIAGNOSIS — M5030 Other cervical disc degeneration, unspecified cervical region: Secondary | ICD-10-CM

## 2016-04-21 MED ORDER — HYDROCODONE-ACETAMINOPHEN 10-325 MG PO TABS: ORAL_TABLET | ORAL | 0 refills | Status: DC

## 2016-04-21 NOTE — Patient Instructions (Addendum)
PLAN   Continue present medication: Hydrocodone acetaminophen  F/U PCP Dr. Lisette Grinder III for evaliation of  BP and general medical  condition  F/U surgical evaluation.  F/U neurological evaluation. May consider PNCV/EMG studies and other studies pending follow-up evaluations  Physical therapy and yoga  as discussed. Please be cautious not to aggravate symptoms at physical therapy  May consider radiofrequency rhizolysis or intraspinal procedures pending response to present treatment and F/U evaluation   Patient to call Pain Management Center should patient have concerns prior to scheduled return appointment.  Script for physical therapy given.

## 2016-04-21 NOTE — Progress Notes (Signed)
Safety precautions to be maintained throughout the outpatient stay will include: orient to surroundings, keep bed in low position, maintain call bell within reach at all times, provide assistance with transfer out of bed and ambulation.  

## 2016-04-21 NOTE — Progress Notes (Signed)
    The patient is a 65 year old gentleman who returns to pain management for further evaluation and treatment of pain involving the neck entire back upper and lower extremity regions. The patient is planning to begin exercise program at this time. We discussed patient's condition and will observe response to patient's exercise program. The patient denies any trauma change in events of daily living the call significant change in symptomatology. We will consider modifications of treatment regimen including interventional treatment should they be related return of significant pain. The patient states that he is able to perform most activities of daily living at this time without severely disabling pain. The patient has occasional pain radiating from the lumbar region toward the lower extremities continuing to the feet which last for short periods of time. We will consider modifications of treatment regimen as discussed. All agreed to suggested treatment plan. Patient will continue hydrocodone acetaminophen as prescribed at this time.    Physical examination  There was tends to palpation of the splenius capitis and occipitalis region palpation which be produced mild to moderate discomfort. There was mild to moderate tenderness of the acromioclavicular and glenohumeral joint region. The patient was with moderate difficulty performing drop test and appeared to be unremarkable Spurling's maneuver. Palpation of the region of the thoracic region was attends to palpation with no crepitus of the thoracic region. There was evidence of muscle spasm of the thoracic region noted. Palpation over the lumbar region was with tenderness to palpation with increased pain with lateral bending rotation extension and palpation of the lumbar facet region. Palpation over the PSIS and PII S region was with moderate tends to palpation with moderate tenderness of the gluteal and piriformis musculature regions. Palpation of the greater  trochanteric region iliotibial band region was with mild discomfort. Straight leg raising was tolerates approximately 30 without increased pain with dorsiflexion noted. EHL strength was was slightly decreased. There was no definite sensory deficit of dermatomal distribution detected. There was negative clonus negative Homans. Abdomen was nontender with no costovertebral angle tenderness noted.     Assessment   Degenerative disc disease lumbar spine Lumbar spondylosis with degenerative disc disease causing multilevel central foraminal and subarticular lateral recess stenosis  Lumbar stenosis with neurogenic claudication  Lumbar facet syndrome  Lumbar radiculopathy  History of right renal cyst with suspected left renal atrophy  Carcinoma of prostate  Sacroiliac joint dysfunction     PLAN   Continue present medication: Hydrocodone acetaminophen  F/U PCP Dr. Lisette Grinder III for evaliation of  BP and general medical  condition  F/U surgical evaluation.  F/U neurological evaluation. May consider PNCV/EMG studies and other studies pending follow-up evaluations  Physical therapy and yoga  as discussed. Please be cautious not to aggravate symptoms at physical therapy  May consider radiofrequency rhizolysis or intraspinal procedures pending response to present treatment and F/U evaluation   Patient to call Pain Management Center should patient have concerns prior to scheduled return appointment.

## 2016-04-26 DIAGNOSIS — M5417 Radiculopathy, lumbosacral region: Secondary | ICD-10-CM | POA: Diagnosis not present

## 2016-04-28 DIAGNOSIS — M5417 Radiculopathy, lumbosacral region: Secondary | ICD-10-CM | POA: Diagnosis not present

## 2016-05-04 DIAGNOSIS — M5417 Radiculopathy, lumbosacral region: Secondary | ICD-10-CM | POA: Diagnosis not present

## 2016-05-05 DIAGNOSIS — Z96641 Presence of right artificial hip joint: Secondary | ICD-10-CM | POA: Diagnosis not present

## 2016-05-05 DIAGNOSIS — M5137 Other intervertebral disc degeneration, lumbosacral region: Secondary | ICD-10-CM | POA: Diagnosis not present

## 2016-05-05 DIAGNOSIS — M25552 Pain in left hip: Secondary | ICD-10-CM | POA: Diagnosis not present

## 2016-05-06 ENCOUNTER — Telehealth: Payer: Self-pay | Admitting: *Deleted

## 2016-05-06 DIAGNOSIS — M5417 Radiculopathy, lumbosacral region: Secondary | ICD-10-CM | POA: Diagnosis not present

## 2016-05-10 ENCOUNTER — Other Ambulatory Visit: Payer: Self-pay | Admitting: Pain Medicine

## 2016-05-12 DIAGNOSIS — M5417 Radiculopathy, lumbosacral region: Secondary | ICD-10-CM | POA: Diagnosis not present

## 2016-05-17 ENCOUNTER — Ambulatory Visit: Payer: Medicare Other | Attending: Pain Medicine | Admitting: Pain Medicine

## 2016-05-17 ENCOUNTER — Encounter: Payer: Self-pay | Admitting: Pain Medicine

## 2016-05-17 VITALS — BP 142/78 | HR 95 | Temp 98.2°F | Resp 18 | Ht 73.0 in | Wt 265.0 lb

## 2016-05-17 DIAGNOSIS — M47816 Spondylosis without myelopathy or radiculopathy, lumbar region: Secondary | ICD-10-CM

## 2016-05-17 DIAGNOSIS — M5416 Radiculopathy, lumbar region: Secondary | ICD-10-CM | POA: Diagnosis not present

## 2016-05-17 DIAGNOSIS — M5116 Intervertebral disc disorders with radiculopathy, lumbar region: Secondary | ICD-10-CM | POA: Diagnosis not present

## 2016-05-17 DIAGNOSIS — N281 Cyst of kidney, acquired: Secondary | ICD-10-CM | POA: Insufficient documentation

## 2016-05-17 DIAGNOSIS — M545 Low back pain: Secondary | ICD-10-CM | POA: Diagnosis present

## 2016-05-17 DIAGNOSIS — M5030 Other cervical disc degeneration, unspecified cervical region: Secondary | ICD-10-CM

## 2016-05-17 DIAGNOSIS — M461 Sacroiliitis, not elsewhere classified: Secondary | ICD-10-CM | POA: Diagnosis not present

## 2016-05-17 DIAGNOSIS — M4726 Other spondylosis with radiculopathy, lumbar region: Secondary | ICD-10-CM | POA: Insufficient documentation

## 2016-05-17 DIAGNOSIS — C61 Malignant neoplasm of prostate: Secondary | ICD-10-CM | POA: Diagnosis not present

## 2016-05-17 DIAGNOSIS — M48061 Spinal stenosis, lumbar region without neurogenic claudication: Secondary | ICD-10-CM

## 2016-05-17 DIAGNOSIS — M47812 Spondylosis without myelopathy or radiculopathy, cervical region: Secondary | ICD-10-CM

## 2016-05-17 DIAGNOSIS — M533 Sacrococcygeal disorders, not elsewhere classified: Secondary | ICD-10-CM

## 2016-05-17 DIAGNOSIS — Z96641 Presence of right artificial hip joint: Secondary | ICD-10-CM

## 2016-05-17 DIAGNOSIS — M5136 Other intervertebral disc degeneration, lumbar region: Secondary | ICD-10-CM

## 2016-05-17 DIAGNOSIS — M47817 Spondylosis without myelopathy or radiculopathy, lumbosacral region: Secondary | ICD-10-CM | POA: Diagnosis not present

## 2016-05-17 DIAGNOSIS — M4806 Spinal stenosis, lumbar region: Secondary | ICD-10-CM | POA: Insufficient documentation

## 2016-05-17 DIAGNOSIS — M791 Myalgia: Secondary | ICD-10-CM | POA: Diagnosis not present

## 2016-05-17 MED ORDER — HYDROCODONE-ACETAMINOPHEN 10-325 MG PO TABS: ORAL_TABLET | ORAL | 0 refills | Status: DC

## 2016-05-17 NOTE — Progress Notes (Signed)
Safety precautions to be maintained throughout the outpatient stay will include: orient to surroundings, keep bed in low position, maintain call bell within reach at all times, provide assistance with transfer out of bed and ambulation.  

## 2016-05-17 NOTE — Patient Instructions (Addendum)
c 

## 2016-05-18 DIAGNOSIS — M5417 Radiculopathy, lumbosacral region: Secondary | ICD-10-CM | POA: Diagnosis not present

## 2016-05-18 NOTE — Progress Notes (Signed)
      The patient is a 65 year old gentleman who returns to pain management for further evaluation and treatment of pain involving the lower back and lower extremity dominant. The patient is concern regarding abnormalities of the lower back region contributing to the lower back lower extremity pain. We discussed patient's condition and detail today and we'll continue present medication and will proceed with lumbar MRI for further assessment of patient's condition. The patient will continue physical therapy for caution to avoid aggravation of symptomatology and we will consider additional modifications of treatment pending response to treatment and follow-up evaluation. All agreed to suggested treatment plan    Physical examination  There was tends to palpation of the paraspinal muscular region cervical region cervical facet region palpation which reproduces pain of mild degree with mild tenderness of the splenius capitis and talus region. There was mild tenderness of the thoracic facet without crepitus of the thoracic region noted. The patient was at unremarkable Spurling's maneuver. The patient was able to perform drop test with only mild to moderate difficulty. Palpation over the lumbar paraspinal musculatures and lumbar facet region was with moderate increased pain with extension palpation over the lumbar there was tenderness to palpation of the PSIS and PII S region a moderate degree with mild tenderness of the greater trochanteric region iliotibial band region. Straight leg raise was tolerates approximately 20 without increased pain with dorsiflexion noted. There was no sensory deficit or dermatomal distribution detected. EHL strength was decreased. There was negative clonus negative Homans.     Assessment     Degenerative disc disease lumbar spine Lumbar spondylosis with degenerative disc disease causing multilevel central foraminal and subarticular lateral recess stenosis  Lumbar  stenosis with neurogenic claudication  Lumbar facet syndrome  Lumbar radiculopathy  History of right renal cyst with suspected left renal atrophy  Carcinoma of prostate

## 2016-05-20 ENCOUNTER — Ambulatory Visit: Payer: PRIVATE HEALTH INSURANCE | Admitting: Pain Medicine

## 2016-05-20 DIAGNOSIS — M5417 Radiculopathy, lumbosacral region: Secondary | ICD-10-CM | POA: Diagnosis not present

## 2016-05-25 ENCOUNTER — Ambulatory Visit: Payer: PRIVATE HEALTH INSURANCE | Admitting: Pain Medicine

## 2016-05-25 DIAGNOSIS — M5417 Radiculopathy, lumbosacral region: Secondary | ICD-10-CM | POA: Diagnosis not present

## 2016-05-27 DIAGNOSIS — M5417 Radiculopathy, lumbosacral region: Secondary | ICD-10-CM | POA: Diagnosis not present

## 2016-05-31 DIAGNOSIS — I10 Essential (primary) hypertension: Secondary | ICD-10-CM | POA: Diagnosis not present

## 2016-05-31 DIAGNOSIS — Z23 Encounter for immunization: Secondary | ICD-10-CM | POA: Diagnosis not present

## 2016-05-31 DIAGNOSIS — R635 Abnormal weight gain: Secondary | ICD-10-CM | POA: Diagnosis not present

## 2016-05-31 DIAGNOSIS — Z6837 Body mass index (BMI) 37.0-37.9, adult: Secondary | ICD-10-CM | POA: Diagnosis not present

## 2016-06-01 DIAGNOSIS — M5417 Radiculopathy, lumbosacral region: Secondary | ICD-10-CM | POA: Diagnosis not present

## 2016-06-03 DIAGNOSIS — F322 Major depressive disorder, single episode, severe without psychotic features: Secondary | ICD-10-CM | POA: Diagnosis not present

## 2016-06-04 DIAGNOSIS — M5417 Radiculopathy, lumbosacral region: Secondary | ICD-10-CM | POA: Diagnosis not present

## 2016-06-16 DIAGNOSIS — M5417 Radiculopathy, lumbosacral region: Secondary | ICD-10-CM | POA: Diagnosis not present

## 2016-06-18 DIAGNOSIS — M5417 Radiculopathy, lumbosacral region: Secondary | ICD-10-CM | POA: Diagnosis not present

## 2016-06-28 ENCOUNTER — Other Ambulatory Visit: Payer: Self-pay | Admitting: Pain Medicine

## 2016-06-29 DIAGNOSIS — M461 Sacroiliitis, not elsewhere classified: Secondary | ICD-10-CM | POA: Diagnosis not present

## 2016-06-29 DIAGNOSIS — M791 Myalgia: Secondary | ICD-10-CM | POA: Diagnosis not present

## 2016-06-29 DIAGNOSIS — M5416 Radiculopathy, lumbar region: Secondary | ICD-10-CM | POA: Diagnosis not present

## 2016-06-29 DIAGNOSIS — M47817 Spondylosis without myelopathy or radiculopathy, lumbosacral region: Secondary | ICD-10-CM | POA: Diagnosis not present

## 2016-07-12 DIAGNOSIS — M5417 Radiculopathy, lumbosacral region: Secondary | ICD-10-CM | POA: Diagnosis not present

## 2016-07-24 DIAGNOSIS — M65332 Trigger finger, left middle finger: Secondary | ICD-10-CM | POA: Diagnosis not present

## 2016-07-24 DIAGNOSIS — Z23 Encounter for immunization: Secondary | ICD-10-CM | POA: Diagnosis not present

## 2016-07-24 DIAGNOSIS — S61211A Laceration without foreign body of left index finger without damage to nail, initial encounter: Secondary | ICD-10-CM | POA: Diagnosis not present

## 2016-07-27 DIAGNOSIS — M5416 Radiculopathy, lumbar region: Secondary | ICD-10-CM | POA: Diagnosis not present

## 2016-07-27 DIAGNOSIS — M791 Myalgia: Secondary | ICD-10-CM | POA: Diagnosis not present

## 2016-07-27 DIAGNOSIS — M461 Sacroiliitis, not elsewhere classified: Secondary | ICD-10-CM | POA: Diagnosis not present

## 2016-07-27 DIAGNOSIS — M47817 Spondylosis without myelopathy or radiculopathy, lumbosacral region: Secondary | ICD-10-CM | POA: Diagnosis not present

## 2016-08-23 DIAGNOSIS — R635 Abnormal weight gain: Secondary | ICD-10-CM | POA: Diagnosis not present

## 2016-08-23 DIAGNOSIS — E663 Overweight: Secondary | ICD-10-CM | POA: Diagnosis not present

## 2016-08-23 DIAGNOSIS — I10 Essential (primary) hypertension: Secondary | ICD-10-CM | POA: Diagnosis not present

## 2016-08-23 DIAGNOSIS — Z6837 Body mass index (BMI) 37.0-37.9, adult: Secondary | ICD-10-CM | POA: Diagnosis not present

## 2016-08-24 DIAGNOSIS — M5416 Radiculopathy, lumbar region: Secondary | ICD-10-CM | POA: Diagnosis not present

## 2016-08-24 DIAGNOSIS — M47817 Spondylosis without myelopathy or radiculopathy, lumbosacral region: Secondary | ICD-10-CM | POA: Diagnosis not present

## 2016-08-24 DIAGNOSIS — M461 Sacroiliitis, not elsewhere classified: Secondary | ICD-10-CM | POA: Diagnosis not present

## 2016-08-24 DIAGNOSIS — M791 Myalgia: Secondary | ICD-10-CM | POA: Diagnosis not present

## 2016-09-16 DIAGNOSIS — I10 Essential (primary) hypertension: Secondary | ICD-10-CM | POA: Diagnosis not present

## 2016-09-16 DIAGNOSIS — Z0000 Encounter for general adult medical examination without abnormal findings: Secondary | ICD-10-CM | POA: Diagnosis not present

## 2016-09-27 ENCOUNTER — Other Ambulatory Visit: Payer: Self-pay | Admitting: Pain Medicine

## 2016-09-27 DIAGNOSIS — M461 Sacroiliitis, not elsewhere classified: Secondary | ICD-10-CM | POA: Diagnosis not present

## 2016-09-27 DIAGNOSIS — M47817 Spondylosis without myelopathy or radiculopathy, lumbosacral region: Secondary | ICD-10-CM | POA: Diagnosis not present

## 2016-09-27 DIAGNOSIS — M5416 Radiculopathy, lumbar region: Secondary | ICD-10-CM | POA: Diagnosis not present

## 2016-09-27 DIAGNOSIS — M791 Myalgia: Secondary | ICD-10-CM | POA: Diagnosis not present

## 2016-10-06 DIAGNOSIS — C61 Malignant neoplasm of prostate: Secondary | ICD-10-CM | POA: Diagnosis not present

## 2016-10-13 DIAGNOSIS — R319 Hematuria, unspecified: Secondary | ICD-10-CM | POA: Diagnosis not present

## 2016-10-13 DIAGNOSIS — I10 Essential (primary) hypertension: Secondary | ICD-10-CM | POA: Diagnosis not present

## 2016-10-13 DIAGNOSIS — C61 Malignant neoplasm of prostate: Secondary | ICD-10-CM | POA: Diagnosis not present

## 2016-10-13 DIAGNOSIS — Z6836 Body mass index (BMI) 36.0-36.9, adult: Secondary | ICD-10-CM | POA: Diagnosis not present

## 2016-10-13 DIAGNOSIS — R635 Abnormal weight gain: Secondary | ICD-10-CM | POA: Diagnosis not present

## 2016-10-13 DIAGNOSIS — R3129 Other microscopic hematuria: Secondary | ICD-10-CM | POA: Diagnosis not present

## 2016-10-27 DIAGNOSIS — F322 Major depressive disorder, single episode, severe without psychotic features: Secondary | ICD-10-CM | POA: Diagnosis not present

## 2016-10-28 DIAGNOSIS — M461 Sacroiliitis, not elsewhere classified: Secondary | ICD-10-CM | POA: Diagnosis not present

## 2016-10-28 DIAGNOSIS — M47817 Spondylosis without myelopathy or radiculopathy, lumbosacral region: Secondary | ICD-10-CM | POA: Diagnosis not present

## 2016-10-28 DIAGNOSIS — M791 Myalgia: Secondary | ICD-10-CM | POA: Diagnosis not present

## 2016-10-28 DIAGNOSIS — M5416 Radiculopathy, lumbar region: Secondary | ICD-10-CM | POA: Diagnosis not present

## 2016-11-03 DIAGNOSIS — E291 Testicular hypofunction: Secondary | ICD-10-CM | POA: Diagnosis not present

## 2016-11-03 DIAGNOSIS — R319 Hematuria, unspecified: Secondary | ICD-10-CM | POA: Diagnosis not present

## 2016-11-03 DIAGNOSIS — N359 Urethral stricture, unspecified: Secondary | ICD-10-CM | POA: Diagnosis not present

## 2016-11-03 DIAGNOSIS — R3129 Other microscopic hematuria: Secondary | ICD-10-CM | POA: Diagnosis not present

## 2016-11-03 DIAGNOSIS — C61 Malignant neoplasm of prostate: Secondary | ICD-10-CM | POA: Diagnosis not present

## 2016-11-03 DIAGNOSIS — C679 Malignant neoplasm of bladder, unspecified: Secondary | ICD-10-CM | POA: Diagnosis not present

## 2016-11-11 DIAGNOSIS — R635 Abnormal weight gain: Secondary | ICD-10-CM | POA: Diagnosis not present

## 2016-11-11 DIAGNOSIS — Z6835 Body mass index (BMI) 35.0-35.9, adult: Secondary | ICD-10-CM | POA: Diagnosis not present

## 2016-11-11 DIAGNOSIS — I10 Essential (primary) hypertension: Secondary | ICD-10-CM | POA: Diagnosis not present

## 2016-11-11 DIAGNOSIS — E669 Obesity, unspecified: Secondary | ICD-10-CM | POA: Diagnosis not present

## 2016-11-23 DIAGNOSIS — G894 Chronic pain syndrome: Secondary | ICD-10-CM | POA: Diagnosis not present

## 2016-11-23 DIAGNOSIS — M47817 Spondylosis without myelopathy or radiculopathy, lumbosacral region: Secondary | ICD-10-CM | POA: Diagnosis not present

## 2016-11-23 DIAGNOSIS — M5416 Radiculopathy, lumbar region: Secondary | ICD-10-CM | POA: Diagnosis not present

## 2016-11-23 DIAGNOSIS — Z79891 Long term (current) use of opiate analgesic: Secondary | ICD-10-CM | POA: Diagnosis not present

## 2016-11-23 DIAGNOSIS — M461 Sacroiliitis, not elsewhere classified: Secondary | ICD-10-CM | POA: Diagnosis not present

## 2016-11-23 DIAGNOSIS — M791 Myalgia: Secondary | ICD-10-CM | POA: Diagnosis not present

## 2016-11-26 DIAGNOSIS — N359 Urethral stricture, unspecified: Secondary | ICD-10-CM | POA: Diagnosis not present

## 2016-11-26 DIAGNOSIS — N358 Other urethral stricture: Secondary | ICD-10-CM | POA: Diagnosis not present

## 2016-11-26 DIAGNOSIS — R319 Hematuria, unspecified: Secondary | ICD-10-CM | POA: Diagnosis not present

## 2016-12-02 DIAGNOSIS — C61 Malignant neoplasm of prostate: Secondary | ICD-10-CM | POA: Diagnosis not present

## 2016-12-02 DIAGNOSIS — N359 Urethral stricture, unspecified: Secondary | ICD-10-CM | POA: Diagnosis not present

## 2016-12-02 DIAGNOSIS — N138 Other obstructive and reflux uropathy: Secondary | ICD-10-CM | POA: Diagnosis not present

## 2016-12-02 DIAGNOSIS — N529 Male erectile dysfunction, unspecified: Secondary | ICD-10-CM | POA: Diagnosis not present

## 2016-12-02 DIAGNOSIS — N401 Enlarged prostate with lower urinary tract symptoms: Secondary | ICD-10-CM | POA: Diagnosis not present

## 2016-12-13 DIAGNOSIS — Z6837 Body mass index (BMI) 37.0-37.9, adult: Secondary | ICD-10-CM | POA: Diagnosis not present

## 2016-12-13 DIAGNOSIS — R635 Abnormal weight gain: Secondary | ICD-10-CM | POA: Diagnosis not present

## 2016-12-21 DIAGNOSIS — M1611 Unilateral primary osteoarthritis, right hip: Secondary | ICD-10-CM | POA: Diagnosis not present

## 2016-12-21 DIAGNOSIS — M47817 Spondylosis without myelopathy or radiculopathy, lumbosacral region: Secondary | ICD-10-CM | POA: Diagnosis not present

## 2016-12-21 DIAGNOSIS — M1711 Unilateral primary osteoarthritis, right knee: Secondary | ICD-10-CM | POA: Diagnosis not present

## 2016-12-21 DIAGNOSIS — Z79891 Long term (current) use of opiate analgesic: Secondary | ICD-10-CM | POA: Diagnosis not present

## 2016-12-21 DIAGNOSIS — M5416 Radiculopathy, lumbar region: Secondary | ICD-10-CM | POA: Diagnosis not present

## 2016-12-21 DIAGNOSIS — M25562 Pain in left knee: Secondary | ICD-10-CM | POA: Diagnosis not present

## 2016-12-21 DIAGNOSIS — M1712 Unilateral primary osteoarthritis, left knee: Secondary | ICD-10-CM | POA: Diagnosis not present

## 2016-12-21 DIAGNOSIS — M1612 Unilateral primary osteoarthritis, left hip: Secondary | ICD-10-CM | POA: Diagnosis not present

## 2016-12-21 DIAGNOSIS — M25552 Pain in left hip: Secondary | ICD-10-CM | POA: Diagnosis not present

## 2016-12-21 DIAGNOSIS — M48061 Spinal stenosis, lumbar region without neurogenic claudication: Secondary | ICD-10-CM | POA: Diagnosis not present

## 2016-12-21 DIAGNOSIS — M461 Sacroiliitis, not elsewhere classified: Secondary | ICD-10-CM | POA: Diagnosis not present

## 2016-12-21 DIAGNOSIS — M791 Myalgia: Secondary | ICD-10-CM | POA: Diagnosis not present

## 2017-01-03 DIAGNOSIS — N359 Urethral stricture, unspecified: Secondary | ICD-10-CM | POA: Diagnosis not present

## 2017-01-03 DIAGNOSIS — Z6833 Body mass index (BMI) 33.0-33.9, adult: Secondary | ICD-10-CM | POA: Diagnosis not present

## 2017-01-03 DIAGNOSIS — R319 Hematuria, unspecified: Secondary | ICD-10-CM | POA: Diagnosis not present

## 2017-01-03 DIAGNOSIS — C61 Malignant neoplasm of prostate: Secondary | ICD-10-CM | POA: Diagnosis not present

## 2017-01-04 DIAGNOSIS — M3210 Systemic lupus erythematosus, organ or system involvement unspecified: Secondary | ICD-10-CM | POA: Diagnosis not present

## 2017-01-04 DIAGNOSIS — N08 Glomerular disorders in diseases classified elsewhere: Secondary | ICD-10-CM | POA: Diagnosis not present

## 2017-01-04 DIAGNOSIS — R319 Hematuria, unspecified: Secondary | ICD-10-CM | POA: Diagnosis not present

## 2017-01-04 DIAGNOSIS — R309 Painful micturition, unspecified: Secondary | ICD-10-CM | POA: Diagnosis not present

## 2017-01-04 DIAGNOSIS — N189 Chronic kidney disease, unspecified: Secondary | ICD-10-CM | POA: Diagnosis not present

## 2017-01-04 DIAGNOSIS — D649 Anemia, unspecified: Secondary | ICD-10-CM | POA: Diagnosis not present

## 2017-01-04 DIAGNOSIS — R809 Proteinuria, unspecified: Secondary | ICD-10-CM | POA: Diagnosis not present

## 2017-01-10 DIAGNOSIS — M79604 Pain in right leg: Secondary | ICD-10-CM | POA: Diagnosis not present

## 2017-01-10 DIAGNOSIS — M461 Sacroiliitis, not elsewhere classified: Secondary | ICD-10-CM | POA: Diagnosis not present

## 2017-01-10 DIAGNOSIS — M5416 Radiculopathy, lumbar region: Secondary | ICD-10-CM | POA: Diagnosis not present

## 2017-01-10 DIAGNOSIS — M791 Myalgia: Secondary | ICD-10-CM | POA: Diagnosis not present

## 2017-01-10 DIAGNOSIS — M47817 Spondylosis without myelopathy or radiculopathy, lumbosacral region: Secondary | ICD-10-CM | POA: Diagnosis not present

## 2017-01-10 DIAGNOSIS — M1612 Unilateral primary osteoarthritis, left hip: Secondary | ICD-10-CM | POA: Diagnosis not present

## 2017-01-10 DIAGNOSIS — M25562 Pain in left knee: Secondary | ICD-10-CM | POA: Diagnosis not present

## 2017-01-10 DIAGNOSIS — M48061 Spinal stenosis, lumbar region without neurogenic claudication: Secondary | ICD-10-CM | POA: Diagnosis not present

## 2017-01-10 DIAGNOSIS — M25561 Pain in right knee: Secondary | ICD-10-CM | POA: Diagnosis not present

## 2017-01-10 DIAGNOSIS — Z79891 Long term (current) use of opiate analgesic: Secondary | ICD-10-CM | POA: Diagnosis not present

## 2017-01-10 DIAGNOSIS — M25552 Pain in left hip: Secondary | ICD-10-CM | POA: Diagnosis not present

## 2017-01-10 DIAGNOSIS — M1611 Unilateral primary osteoarthritis, right hip: Secondary | ICD-10-CM | POA: Diagnosis not present

## 2017-01-12 DIAGNOSIS — Z6836 Body mass index (BMI) 36.0-36.9, adult: Secondary | ICD-10-CM | POA: Diagnosis not present

## 2017-01-12 DIAGNOSIS — R635 Abnormal weight gain: Secondary | ICD-10-CM | POA: Diagnosis not present

## 2017-01-12 DIAGNOSIS — I10 Essential (primary) hypertension: Secondary | ICD-10-CM | POA: Diagnosis not present

## 2017-01-20 DIAGNOSIS — M1120 Other chondrocalcinosis, unspecified site: Secondary | ICD-10-CM | POA: Diagnosis not present

## 2017-01-20 DIAGNOSIS — M25561 Pain in right knee: Secondary | ICD-10-CM | POA: Diagnosis not present

## 2017-01-20 DIAGNOSIS — R52 Pain, unspecified: Secondary | ICD-10-CM | POA: Diagnosis not present

## 2017-01-20 DIAGNOSIS — S83289S Other tear of lateral meniscus, current injury, unspecified knee, sequela: Secondary | ICD-10-CM | POA: Diagnosis not present

## 2017-01-21 DIAGNOSIS — M47817 Spondylosis without myelopathy or radiculopathy, lumbosacral region: Secondary | ICD-10-CM | POA: Diagnosis not present

## 2017-01-21 DIAGNOSIS — M25561 Pain in right knee: Secondary | ICD-10-CM | POA: Diagnosis not present

## 2017-01-21 DIAGNOSIS — M48061 Spinal stenosis, lumbar region without neurogenic claudication: Secondary | ICD-10-CM | POA: Diagnosis not present

## 2017-01-21 DIAGNOSIS — M1611 Unilateral primary osteoarthritis, right hip: Secondary | ICD-10-CM | POA: Diagnosis not present

## 2017-01-21 DIAGNOSIS — M79604 Pain in right leg: Secondary | ICD-10-CM | POA: Diagnosis not present

## 2017-01-21 DIAGNOSIS — M5416 Radiculopathy, lumbar region: Secondary | ICD-10-CM | POA: Diagnosis not present

## 2017-01-21 DIAGNOSIS — M461 Sacroiliitis, not elsewhere classified: Secondary | ICD-10-CM | POA: Diagnosis not present

## 2017-01-21 DIAGNOSIS — M791 Myalgia: Secondary | ICD-10-CM | POA: Diagnosis not present

## 2017-01-21 DIAGNOSIS — M1612 Unilateral primary osteoarthritis, left hip: Secondary | ICD-10-CM | POA: Diagnosis not present

## 2017-01-21 DIAGNOSIS — M25552 Pain in left hip: Secondary | ICD-10-CM | POA: Diagnosis not present

## 2017-01-21 DIAGNOSIS — M25562 Pain in left knee: Secondary | ICD-10-CM | POA: Diagnosis not present

## 2017-01-21 DIAGNOSIS — Z79891 Long term (current) use of opiate analgesic: Secondary | ICD-10-CM | POA: Diagnosis not present

## 2017-02-11 DIAGNOSIS — Z6836 Body mass index (BMI) 36.0-36.9, adult: Secondary | ICD-10-CM | POA: Diagnosis not present

## 2017-02-11 DIAGNOSIS — R635 Abnormal weight gain: Secondary | ICD-10-CM | POA: Diagnosis not present

## 2017-03-14 DIAGNOSIS — I10 Essential (primary) hypertension: Secondary | ICD-10-CM | POA: Diagnosis not present

## 2017-03-14 DIAGNOSIS — E7800 Pure hypercholesterolemia, unspecified: Secondary | ICD-10-CM | POA: Diagnosis not present

## 2017-03-14 DIAGNOSIS — M519 Unspecified thoracic, thoracolumbar and lumbosacral intervertebral disc disorder: Secondary | ICD-10-CM | POA: Diagnosis not present

## 2017-03-14 DIAGNOSIS — C61 Malignant neoplasm of prostate: Secondary | ICD-10-CM | POA: Diagnosis not present

## 2017-03-14 DIAGNOSIS — F410 Panic disorder [episodic paroxysmal anxiety] without agoraphobia: Secondary | ICD-10-CM | POA: Diagnosis not present

## 2017-03-15 DIAGNOSIS — R635 Abnormal weight gain: Secondary | ICD-10-CM | POA: Diagnosis not present

## 2017-03-15 DIAGNOSIS — Z6834 Body mass index (BMI) 34.0-34.9, adult: Secondary | ICD-10-CM | POA: Diagnosis not present

## 2017-04-09 DIAGNOSIS — Z8262 Family history of osteoporosis: Secondary | ICD-10-CM | POA: Diagnosis not present

## 2017-04-09 DIAGNOSIS — M545 Low back pain: Secondary | ICD-10-CM | POA: Diagnosis not present

## 2017-04-09 DIAGNOSIS — M549 Dorsalgia, unspecified: Secondary | ICD-10-CM | POA: Diagnosis not present

## 2017-04-09 DIAGNOSIS — I10 Essential (primary) hypertension: Secondary | ICD-10-CM | POA: Diagnosis not present

## 2017-04-09 DIAGNOSIS — M25552 Pain in left hip: Secondary | ICD-10-CM | POA: Diagnosis not present

## 2017-04-09 DIAGNOSIS — S39012A Strain of muscle, fascia and tendon of lower back, initial encounter: Secondary | ICD-10-CM | POA: Diagnosis not present

## 2017-04-09 DIAGNOSIS — Z87891 Personal history of nicotine dependence: Secondary | ICD-10-CM | POA: Diagnosis not present

## 2017-04-19 DIAGNOSIS — M1612 Unilateral primary osteoarthritis, left hip: Secondary | ICD-10-CM | POA: Diagnosis not present

## 2017-04-19 DIAGNOSIS — M79604 Pain in right leg: Secondary | ICD-10-CM | POA: Diagnosis not present

## 2017-04-19 DIAGNOSIS — M47817 Spondylosis without myelopathy or radiculopathy, lumbosacral region: Secondary | ICD-10-CM | POA: Diagnosis not present

## 2017-04-19 DIAGNOSIS — M25562 Pain in left knee: Secondary | ICD-10-CM | POA: Diagnosis not present

## 2017-04-19 DIAGNOSIS — M461 Sacroiliitis, not elsewhere classified: Secondary | ICD-10-CM | POA: Diagnosis not present

## 2017-04-19 DIAGNOSIS — M48061 Spinal stenosis, lumbar region without neurogenic claudication: Secondary | ICD-10-CM | POA: Diagnosis not present

## 2017-04-19 DIAGNOSIS — M1611 Unilateral primary osteoarthritis, right hip: Secondary | ICD-10-CM | POA: Diagnosis not present

## 2017-04-19 DIAGNOSIS — M25552 Pain in left hip: Secondary | ICD-10-CM | POA: Diagnosis not present

## 2017-04-19 DIAGNOSIS — M791 Myalgia: Secondary | ICD-10-CM | POA: Diagnosis not present

## 2017-04-19 DIAGNOSIS — Z79891 Long term (current) use of opiate analgesic: Secondary | ICD-10-CM | POA: Diagnosis not present

## 2017-04-19 DIAGNOSIS — M25561 Pain in right knee: Secondary | ICD-10-CM | POA: Diagnosis not present

## 2017-04-19 DIAGNOSIS — M5416 Radiculopathy, lumbar region: Secondary | ICD-10-CM | POA: Diagnosis not present

## 2017-04-28 DIAGNOSIS — H524 Presbyopia: Secondary | ICD-10-CM | POA: Diagnosis not present

## 2017-04-28 DIAGNOSIS — I10 Essential (primary) hypertension: Secondary | ICD-10-CM | POA: Diagnosis not present

## 2017-04-28 DIAGNOSIS — H35033 Hypertensive retinopathy, bilateral: Secondary | ICD-10-CM | POA: Diagnosis not present

## 2017-04-28 DIAGNOSIS — H353131 Nonexudative age-related macular degeneration, bilateral, early dry stage: Secondary | ICD-10-CM | POA: Diagnosis not present

## 2017-05-18 DIAGNOSIS — Z6836 Body mass index (BMI) 36.0-36.9, adult: Secondary | ICD-10-CM | POA: Diagnosis not present

## 2017-05-18 DIAGNOSIS — E669 Obesity, unspecified: Secondary | ICD-10-CM | POA: Diagnosis not present

## 2017-05-18 DIAGNOSIS — R635 Abnormal weight gain: Secondary | ICD-10-CM | POA: Diagnosis not present

## 2017-05-26 DIAGNOSIS — M4180 Other forms of scoliosis, site unspecified: Secondary | ICD-10-CM | POA: Diagnosis not present

## 2017-05-26 DIAGNOSIS — M5431 Sciatica, right side: Secondary | ICD-10-CM | POA: Diagnosis not present

## 2017-05-26 DIAGNOSIS — M4150 Other secondary scoliosis, site unspecified: Secondary | ICD-10-CM | POA: Diagnosis not present

## 2017-05-26 DIAGNOSIS — M5432 Sciatica, left side: Secondary | ICD-10-CM | POA: Diagnosis not present

## 2017-05-26 DIAGNOSIS — M4726 Other spondylosis with radiculopathy, lumbar region: Secondary | ICD-10-CM | POA: Diagnosis not present

## 2017-06-04 DIAGNOSIS — M5126 Other intervertebral disc displacement, lumbar region: Secondary | ICD-10-CM | POA: Diagnosis not present

## 2017-06-04 DIAGNOSIS — M48061 Spinal stenosis, lumbar region without neurogenic claudication: Secondary | ICD-10-CM | POA: Diagnosis not present

## 2017-06-04 DIAGNOSIS — M545 Low back pain: Secondary | ICD-10-CM | POA: Diagnosis not present

## 2017-06-04 DIAGNOSIS — M5136 Other intervertebral disc degeneration, lumbar region: Secondary | ICD-10-CM | POA: Diagnosis not present

## 2017-06-06 DIAGNOSIS — M79605 Pain in left leg: Secondary | ICD-10-CM | POA: Diagnosis not present

## 2017-06-06 DIAGNOSIS — G894 Chronic pain syndrome: Secondary | ICD-10-CM | POA: Diagnosis not present

## 2017-06-06 DIAGNOSIS — M545 Low back pain: Secondary | ICD-10-CM | POA: Diagnosis not present

## 2017-06-13 DIAGNOSIS — G894 Chronic pain syndrome: Secondary | ICD-10-CM | POA: Diagnosis not present

## 2017-06-13 DIAGNOSIS — M79605 Pain in left leg: Secondary | ICD-10-CM | POA: Diagnosis not present

## 2017-06-13 DIAGNOSIS — M545 Low back pain: Secondary | ICD-10-CM | POA: Diagnosis not present

## 2017-06-14 DIAGNOSIS — M4726 Other spondylosis with radiculopathy, lumbar region: Secondary | ICD-10-CM | POA: Diagnosis not present

## 2017-06-14 DIAGNOSIS — M5432 Sciatica, left side: Secondary | ICD-10-CM | POA: Diagnosis not present

## 2017-06-14 DIAGNOSIS — M5431 Sciatica, right side: Secondary | ICD-10-CM | POA: Diagnosis not present

## 2017-06-14 DIAGNOSIS — M4180 Other forms of scoliosis, site unspecified: Secondary | ICD-10-CM | POA: Diagnosis not present

## 2017-06-20 DIAGNOSIS — M545 Low back pain: Secondary | ICD-10-CM | POA: Diagnosis not present

## 2017-06-20 DIAGNOSIS — G894 Chronic pain syndrome: Secondary | ICD-10-CM | POA: Diagnosis not present

## 2017-06-20 DIAGNOSIS — M79605 Pain in left leg: Secondary | ICD-10-CM | POA: Diagnosis not present

## 2017-06-24 DIAGNOSIS — M545 Low back pain: Secondary | ICD-10-CM | POA: Diagnosis not present

## 2017-06-24 DIAGNOSIS — M79605 Pain in left leg: Secondary | ICD-10-CM | POA: Diagnosis not present

## 2017-06-24 DIAGNOSIS — G894 Chronic pain syndrome: Secondary | ICD-10-CM | POA: Diagnosis not present

## 2017-07-01 DIAGNOSIS — E669 Obesity, unspecified: Secondary | ICD-10-CM | POA: Diagnosis not present

## 2017-07-01 DIAGNOSIS — F419 Anxiety disorder, unspecified: Secondary | ICD-10-CM | POA: Diagnosis not present

## 2017-07-01 DIAGNOSIS — R635 Abnormal weight gain: Secondary | ICD-10-CM | POA: Diagnosis not present

## 2017-07-01 DIAGNOSIS — N529 Male erectile dysfunction, unspecified: Secondary | ICD-10-CM | POA: Diagnosis not present

## 2017-07-01 DIAGNOSIS — Z23 Encounter for immunization: Secondary | ICD-10-CM | POA: Diagnosis not present

## 2017-07-01 DIAGNOSIS — Z6836 Body mass index (BMI) 36.0-36.9, adult: Secondary | ICD-10-CM | POA: Diagnosis not present

## 2017-07-04 DIAGNOSIS — M5416 Radiculopathy, lumbar region: Secondary | ICD-10-CM | POA: Diagnosis not present

## 2017-07-07 DIAGNOSIS — M545 Low back pain: Secondary | ICD-10-CM | POA: Diagnosis not present

## 2017-07-07 DIAGNOSIS — G894 Chronic pain syndrome: Secondary | ICD-10-CM | POA: Diagnosis not present

## 2017-07-07 DIAGNOSIS — M79605 Pain in left leg: Secondary | ICD-10-CM | POA: Diagnosis not present

## 2017-07-19 DIAGNOSIS — M5416 Radiculopathy, lumbar region: Secondary | ICD-10-CM | POA: Diagnosis not present

## 2017-07-26 DIAGNOSIS — M5431 Sciatica, right side: Secondary | ICD-10-CM | POA: Diagnosis not present

## 2017-07-26 DIAGNOSIS — M5432 Sciatica, left side: Secondary | ICD-10-CM | POA: Diagnosis not present

## 2017-07-26 DIAGNOSIS — M48062 Spinal stenosis, lumbar region with neurogenic claudication: Secondary | ICD-10-CM | POA: Diagnosis not present

## 2017-07-26 DIAGNOSIS — M5416 Radiculopathy, lumbar region: Secondary | ICD-10-CM | POA: Diagnosis not present

## 2017-08-01 DIAGNOSIS — E669 Obesity, unspecified: Secondary | ICD-10-CM | POA: Diagnosis not present

## 2017-08-01 DIAGNOSIS — R635 Abnormal weight gain: Secondary | ICD-10-CM | POA: Diagnosis not present

## 2017-08-01 DIAGNOSIS — F419 Anxiety disorder, unspecified: Secondary | ICD-10-CM | POA: Diagnosis not present

## 2017-08-01 DIAGNOSIS — Z6837 Body mass index (BMI) 37.0-37.9, adult: Secondary | ICD-10-CM | POA: Diagnosis not present

## 2017-08-23 DIAGNOSIS — M5416 Radiculopathy, lumbar region: Secondary | ICD-10-CM | POA: Diagnosis not present

## 2017-09-02 DIAGNOSIS — E669 Obesity, unspecified: Secondary | ICD-10-CM | POA: Diagnosis not present

## 2017-09-02 DIAGNOSIS — R635 Abnormal weight gain: Secondary | ICD-10-CM | POA: Diagnosis not present

## 2017-09-02 DIAGNOSIS — F419 Anxiety disorder, unspecified: Secondary | ICD-10-CM | POA: Diagnosis not present

## 2017-09-02 DIAGNOSIS — Z6837 Body mass index (BMI) 37.0-37.9, adult: Secondary | ICD-10-CM | POA: Diagnosis not present

## 2017-09-13 DIAGNOSIS — M5431 Sciatica, right side: Secondary | ICD-10-CM | POA: Diagnosis not present

## 2017-09-13 DIAGNOSIS — M48062 Spinal stenosis, lumbar region with neurogenic claudication: Secondary | ICD-10-CM | POA: Diagnosis not present

## 2017-09-13 DIAGNOSIS — M5432 Sciatica, left side: Secondary | ICD-10-CM | POA: Diagnosis not present

## 2017-09-13 DIAGNOSIS — M4726 Other spondylosis with radiculopathy, lumbar region: Secondary | ICD-10-CM | POA: Diagnosis not present

## 2017-09-14 DIAGNOSIS — E7800 Pure hypercholesterolemia, unspecified: Secondary | ICD-10-CM | POA: Diagnosis not present

## 2017-09-14 DIAGNOSIS — I10 Essential (primary) hypertension: Secondary | ICD-10-CM | POA: Diagnosis not present

## 2017-09-14 DIAGNOSIS — F410 Panic disorder [episodic paroxysmal anxiety] without agoraphobia: Secondary | ICD-10-CM | POA: Diagnosis not present

## 2017-09-14 DIAGNOSIS — Z5181 Encounter for therapeutic drug level monitoring: Secondary | ICD-10-CM | POA: Diagnosis not present

## 2017-09-14 DIAGNOSIS — M519 Unspecified thoracic, thoracolumbar and lumbosacral intervertebral disc disorder: Secondary | ICD-10-CM | POA: Diagnosis not present

## 2017-10-05 DIAGNOSIS — Z6837 Body mass index (BMI) 37.0-37.9, adult: Secondary | ICD-10-CM | POA: Diagnosis not present

## 2017-10-05 DIAGNOSIS — I10 Essential (primary) hypertension: Secondary | ICD-10-CM | POA: Diagnosis not present

## 2017-10-05 DIAGNOSIS — Z9181 History of falling: Secondary | ICD-10-CM | POA: Diagnosis not present

## 2017-10-05 DIAGNOSIS — R635 Abnormal weight gain: Secondary | ICD-10-CM | POA: Diagnosis not present

## 2017-11-04 DIAGNOSIS — I10 Essential (primary) hypertension: Secondary | ICD-10-CM | POA: Diagnosis not present

## 2017-11-04 DIAGNOSIS — R635 Abnormal weight gain: Secondary | ICD-10-CM | POA: Diagnosis not present

## 2017-11-04 DIAGNOSIS — Z6837 Body mass index (BMI) 37.0-37.9, adult: Secondary | ICD-10-CM | POA: Diagnosis not present

## 2017-11-07 DIAGNOSIS — R21 Rash and other nonspecific skin eruption: Secondary | ICD-10-CM | POA: Diagnosis not present

## 2017-11-07 DIAGNOSIS — I10 Essential (primary) hypertension: Secondary | ICD-10-CM | POA: Diagnosis not present

## 2017-11-21 DIAGNOSIS — S80862A Insect bite (nonvenomous), left lower leg, initial encounter: Secondary | ICD-10-CM | POA: Diagnosis not present

## 2017-11-21 DIAGNOSIS — W57XXXA Bitten or stung by nonvenomous insect and other nonvenomous arthropods, initial encounter: Secondary | ICD-10-CM | POA: Diagnosis not present

## 2017-11-29 DIAGNOSIS — M5432 Sciatica, left side: Secondary | ICD-10-CM | POA: Diagnosis not present

## 2017-12-27 DIAGNOSIS — R4182 Altered mental status, unspecified: Secondary | ICD-10-CM | POA: Diagnosis not present

## 2017-12-27 DIAGNOSIS — R402441 Other coma, without documented Glasgow coma scale score, or with partial score reported, in the field [EMT or ambulance]: Secondary | ICD-10-CM | POA: Diagnosis not present

## 2017-12-27 DIAGNOSIS — R05 Cough: Secondary | ICD-10-CM | POA: Diagnosis not present

## 2017-12-28 DIAGNOSIS — I4510 Unspecified right bundle-branch block: Secondary | ICD-10-CM | POA: Diagnosis not present

## 2017-12-28 DIAGNOSIS — I6523 Occlusion and stenosis of bilateral carotid arteries: Secondary | ICD-10-CM | POA: Diagnosis not present

## 2017-12-28 DIAGNOSIS — S0990XA Unspecified injury of head, initial encounter: Secondary | ICD-10-CM | POA: Diagnosis not present

## 2017-12-28 DIAGNOSIS — R9431 Abnormal electrocardiogram [ECG] [EKG]: Secondary | ICD-10-CM | POA: Diagnosis not present

## 2017-12-28 DIAGNOSIS — I444 Left anterior fascicular block: Secondary | ICD-10-CM | POA: Diagnosis not present

## 2017-12-28 DIAGNOSIS — R4182 Altered mental status, unspecified: Secondary | ICD-10-CM | POA: Diagnosis not present

## 2018-01-02 DIAGNOSIS — Z09 Encounter for follow-up examination after completed treatment for conditions other than malignant neoplasm: Secondary | ICD-10-CM | POA: Diagnosis not present

## 2018-01-09 DIAGNOSIS — Z6837 Body mass index (BMI) 37.0-37.9, adult: Secondary | ICD-10-CM | POA: Diagnosis not present

## 2018-01-09 DIAGNOSIS — R635 Abnormal weight gain: Secondary | ICD-10-CM | POA: Diagnosis not present

## 2018-01-09 DIAGNOSIS — I10 Essential (primary) hypertension: Secondary | ICD-10-CM | POA: Diagnosis not present

## 2018-02-09 DIAGNOSIS — I10 Essential (primary) hypertension: Secondary | ICD-10-CM | POA: Diagnosis not present

## 2018-02-09 DIAGNOSIS — R635 Abnormal weight gain: Secondary | ICD-10-CM | POA: Diagnosis not present

## 2018-02-09 DIAGNOSIS — Z6837 Body mass index (BMI) 37.0-37.9, adult: Secondary | ICD-10-CM | POA: Diagnosis not present

## 2018-02-09 DIAGNOSIS — E669 Obesity, unspecified: Secondary | ICD-10-CM | POA: Diagnosis not present

## 2018-02-28 DIAGNOSIS — C61 Malignant neoplasm of prostate: Secondary | ICD-10-CM | POA: Diagnosis not present

## 2018-02-28 DIAGNOSIS — I10 Essential (primary) hypertension: Secondary | ICD-10-CM | POA: Diagnosis not present

## 2018-02-28 DIAGNOSIS — F410 Panic disorder [episodic paroxysmal anxiety] without agoraphobia: Secondary | ICD-10-CM | POA: Diagnosis not present

## 2018-02-28 DIAGNOSIS — Z0000 Encounter for general adult medical examination without abnormal findings: Secondary | ICD-10-CM | POA: Diagnosis not present

## 2018-02-28 DIAGNOSIS — M519 Unspecified thoracic, thoracolumbar and lumbosacral intervertebral disc disorder: Secondary | ICD-10-CM | POA: Diagnosis not present

## 2018-02-28 DIAGNOSIS — E7800 Pure hypercholesterolemia, unspecified: Secondary | ICD-10-CM | POA: Diagnosis not present

## 2018-03-14 DIAGNOSIS — Z6836 Body mass index (BMI) 36.0-36.9, adult: Secondary | ICD-10-CM | POA: Diagnosis not present

## 2018-03-14 DIAGNOSIS — R635 Abnormal weight gain: Secondary | ICD-10-CM | POA: Diagnosis not present

## 2018-03-14 DIAGNOSIS — I10 Essential (primary) hypertension: Secondary | ICD-10-CM | POA: Diagnosis not present

## 2018-03-24 DIAGNOSIS — N5201 Erectile dysfunction due to arterial insufficiency: Secondary | ICD-10-CM | POA: Diagnosis not present

## 2018-03-24 DIAGNOSIS — C61 Malignant neoplasm of prostate: Secondary | ICD-10-CM | POA: Diagnosis not present

## 2018-03-24 DIAGNOSIS — N401 Enlarged prostate with lower urinary tract symptoms: Secondary | ICD-10-CM | POA: Diagnosis not present

## 2018-03-31 DIAGNOSIS — G8929 Other chronic pain: Secondary | ICD-10-CM | POA: Diagnosis not present

## 2018-03-31 DIAGNOSIS — M545 Low back pain: Secondary | ICD-10-CM | POA: Diagnosis not present

## 2018-03-31 DIAGNOSIS — M519 Unspecified thoracic, thoracolumbar and lumbosacral intervertebral disc disorder: Secondary | ICD-10-CM | POA: Diagnosis not present

## 2018-03-31 DIAGNOSIS — I10 Essential (primary) hypertension: Secondary | ICD-10-CM | POA: Diagnosis not present

## 2018-04-12 DIAGNOSIS — G8929 Other chronic pain: Secondary | ICD-10-CM | POA: Diagnosis not present

## 2018-04-12 DIAGNOSIS — M545 Low back pain: Secondary | ICD-10-CM | POA: Diagnosis not present

## 2018-04-12 DIAGNOSIS — M5030 Other cervical disc degeneration, unspecified cervical region: Secondary | ICD-10-CM | POA: Diagnosis not present

## 2018-04-17 DIAGNOSIS — I10 Essential (primary) hypertension: Secondary | ICD-10-CM | POA: Diagnosis not present

## 2018-04-17 DIAGNOSIS — R635 Abnormal weight gain: Secondary | ICD-10-CM | POA: Diagnosis not present

## 2018-04-17 DIAGNOSIS — Z6835 Body mass index (BMI) 35.0-35.9, adult: Secondary | ICD-10-CM | POA: Diagnosis not present

## 2018-04-20 ENCOUNTER — Ambulatory Visit: Payer: Medicare Other

## 2018-04-20 ENCOUNTER — Other Ambulatory Visit: Payer: Self-pay

## 2018-04-20 ENCOUNTER — Encounter: Payer: Self-pay | Admitting: Emergency Medicine

## 2018-04-20 ENCOUNTER — Ambulatory Visit
Admission: EM | Admit: 2018-04-20 | Discharge: 2018-04-20 | Disposition: A | Discharge status: Home / Self Care | Payer: Medicare Other | Attending: Family Medicine | Admitting: Family Medicine

## 2018-04-20 DIAGNOSIS — M48061 Spinal stenosis, lumbar region without neurogenic claudication: Secondary | ICD-10-CM | POA: Diagnosis not present

## 2018-04-20 DIAGNOSIS — M5116 Intervertebral disc disorders with radiculopathy, lumbar region: Secondary | ICD-10-CM | POA: Insufficient documentation

## 2018-04-20 DIAGNOSIS — Z7982 Long term (current) use of aspirin: Secondary | ICD-10-CM | POA: Insufficient documentation

## 2018-04-20 DIAGNOSIS — Z79899 Other long term (current) drug therapy: Secondary | ICD-10-CM | POA: Insufficient documentation

## 2018-04-20 DIAGNOSIS — Z87891 Personal history of nicotine dependence: Secondary | ICD-10-CM | POA: Insufficient documentation

## 2018-04-20 DIAGNOSIS — M79672 Pain in left foot: Secondary | ICD-10-CM | POA: Insufficient documentation

## 2018-04-20 DIAGNOSIS — Z8546 Personal history of malignant neoplasm of prostate: Secondary | ICD-10-CM | POA: Insufficient documentation

## 2018-04-20 DIAGNOSIS — G8929 Other chronic pain: Secondary | ICD-10-CM | POA: Diagnosis not present

## 2018-04-20 DIAGNOSIS — S99922A Unspecified injury of left foot, initial encounter: Secondary | ICD-10-CM | POA: Diagnosis not present

## 2018-04-20 MED ORDER — KETOROLAC TROMETHAMINE 60 MG/2ML IM SOLN
30.0000 mg | Freq: Once | INTRAMUSCULAR | Status: AC
  Administered 2018-04-20: 30 mg via INTRAMUSCULAR

## 2018-04-20 NOTE — ED Provider Notes (Signed)
[facility]    CSN: [id number] Arrival date & time: 04/20/18  0840  History   Chief Complaint Left foot pain  HPI  67 year old male with chronic low back pain on chronic narcotics presents with left foot pain.  Per the patient, this is an ongoing issue.  Has been going on for the past month.  Patient reports that he has numbness of the foot and tingling on the dorsum of the foot.  Patient also endorses medial pain.  He states that he was recently seen by his primary care physician and prior to that was seen by his PA.  He was treated with prednisone.  He has an upcoming appointment with podiatry on the 19th.  Patient states that he had no improvement with the prednisone.  Patient states that last night his pain was severe.  Interfered with sleep and he was unable to sleep.  Patient describes the pain as a cramping sensation which seems to be a muscle cramp.  He states that it improved after he "walked it off".  Patient states that he is been having difficulty with ambulation and is having to use a cane.  No relieving factors.  No other associated symptoms.  No other complaints.  Past Medical History:  Diagnosis Date  . Bronchitis   . Prostate cancer [facility]) 2014   had radiation treatments  . Sinusitis    Patient Active Problem List   Diagnosis Date Noted  . Status post hip surgery 05/14/2015  . DDD (degenerative disc disease), lumbar 02/20/2015  . Facet syndrome, lumbar 02/20/2015  . Lumbar radiculopathy 02/20/2015  . DDD (degenerative disc disease), cervical 02/20/2015  . Cervical facet syndrome 02/20/2015  . Sacroiliac joint dysfunction 02/20/2015  . Spinal stenosis of lumbar region 02/20/2015   Past Surgical History:  Procedure Laterality Date  . HIP SURGERY Right 2011   Home Medications    Prior to Admission medications   Medication Sig Start Date End Date Taking? Authorizing Provider  ALPRAZolam Duanne Moron) 1 MG tablet Take 1 mg by mouth as needed for anxiety.    Yes [provider]  aspirin EC 81 MG tablet Take 162 mg by mouth daily.   Yes [provider]  DULoxetine (CYMBALTA) 60 MG capsule Take 90 mg by mouth daily.    Yes [provider]  furosemide (LASIX) 40 MG tablet Take 40 mg by mouth daily.   Yes [provider]  gabapentin (NEURONTIN) 100 MG capsule TAKE 1 CAPSULE BY MOUTH THREE TIMES A DAY 04/12/18  Yes [provider]  HYDROcodone-acetaminophen (NORCO) 10-325 MG tablet Limit one tablet by mouth 2-3 times per day if tolerated 05/17/16  Yes Mohammed Kindle, MD  lisinopril (PRINIVIL,ZESTRIL) 20 MG tablet Take by mouth. 09/14/16  Yes [provider]  phentermine 37.5 MG capsule Take 37.5 mg by mouth daily. Reported on 12/15/2015   Yes [provider]  sildenafil (REVATIO) 20 MG tablet Take 2 to 5 tablets daily as needed 01/05/18  Yes [provider]  zolpidem (AMBIEN) 10 MG tablet Take 10 mg by mouth at bedtime as needed for sleep.   Yes [provider]  ibuprofen (ADVIL,MOTRIN) 200 MG tablet Take 200 mg by mouth 3 (three) times daily. Takes 3 tablets  Two to three times daily    [provider]   Family History Family History  Problem Relation Age of Onset  . Bowel Disease Mother   . Leukemia Father    Social History Social History  Tobacco Use  . Smoking status: Former Research scientist (life sciences)  . Smokeless tobacco: Former Systems developer    Quit date: 02/19/1986  Substance Use Topics  . Alcohol use: Not Currently    Alcohol/week: 3.0 standard drinks    Types: 3 Standard drinks or equivalent per week    Comment: couple glasses of wine with meals  . Drug use: No   Allergies   Patient has no known allergies.  Review of Systems Review of Systems  Constitutional: Negative.   Musculoskeletal: Positive for back pain.       Left foot pain.   Physical Exam Triage Vital Signs ED Triage Vitals  Enc Vitals Group     BP 04/20/18 0901 129/89     Pulse Rate 04/20/18 0901 91      Resp 04/20/18 0901 16     Temp 04/20/18 0901 (!) 97.5 F (36.4 C)     Temp Source 04/20/18 0901 Oral     SpO2 04/20/18 0901 97 %     Weight 04/20/18 0901 254 lb (115.2 kg)     Height 04/20/18 0901 6\' 1"  (1.854 m)     Head Circumference --      Peak Flow --      Pain Score 04/20/18 0857 2     Pain Loc --      Pain Edu? --      Excl. in [place]? --    Updated Vital Signs BP 129/89 (BP Location: Left Arm)   Pulse 91   Temp (!) 97.5 F (36.4 C) (Oral)   Resp 16   Ht 6\' 1"  (1.854 m)   Wt 115.2 kg   SpO2 97%   BMI 33.51 kg/m   Physical Exam  Constitutional: He is oriented to person, place, and time. He appears well-developed. No distress.  HENT:  Head: Normocephalic and atraumatic.  Cardiovascular: Normal rate and regular rhythm.  Pulmonary/Chest: Effort normal. No respiratory distress.  Musculoskeletal:  Left foot -normal to inspection.  Patient has tenderness medially around the navicular. Normal range of motion of the ankle.  Patient does have mild pes planus.  Neurological: He is alert and oriented to person, place, and time.  Psychiatric: He has a normal mood and affect. His behavior is normal.  Nursing note and vitals reviewed.  UC Treatments / Results  Labs (all labs ordered are listed, but only abnormal results are displayed) Labs Reviewed - No data to display  EKG None  Radiology Dg Foot Complete Left  Result Date: 04/20/2018 CLINICAL DATA:  Pain on the foot over navicular region for 4 weeks without trauma. EXAM: LEFT FOOT - COMPLETE 3+ VIEW COMPARISON:  None. FINDINGS: There is no evidence of fracture or dislocation. There is no evidence of arthropathy or other focal bone abnormality. Soft tissues are unremarkable. IMPRESSION: Negative. Electronically Signed   By: Dorise Bullion III M.D   On: 04/20/2018 09:47    Procedures Procedures (including critical care time)  Medications Ordered in UC Medications  ketorolac (TORADOL) injection 30 mg (has no  administration in time range)    Initial Impression / Assessment and Plan / UC Course  I have reviewed the triage vital signs and the nursing notes.  Pertinent labs & imaging results that were available during my care of the patient were reviewed by me and considered in my medical decision making (see chart for details).    67 year old male presents with foot pain.  Patient's numbness and tingling is coming from his low back.  His recent bout of worsening pain last night appears to be secondary to nocturnal cramps.  X-ray was obtained today and was negative.  I advised the patient to see podiatry as scheduled.  Toradol given for pain.  Patient is to continue his home pain medication.  Final Clinical Impressions(s) / UC Diagnoses   Final diagnoses:  Left foot pain     Discharge Instructions     Stretch before going to bed.  Continue your home pain medication.  See podiatry.  Take care  Dr. Lacinda Axon     ED Prescriptions    None     Controlled Substance Prescriptions [place] Controlled Substance Registry consulted? Not Applicable   Coral Spikes, [place] 04/20/18 [phone]

## 2018-04-20 NOTE — Discharge Instructions (Signed)
Stretch before going to bed.  Continue your home pain medication.  See podiatry.  Take care  Dr. Lacinda Axon

## 2018-04-20 NOTE — ED Triage Notes (Signed)
Pt present to urgent care for left foot pain. Last night he did not sleep due to having cramp like spasm in his foot. His foot is swollen. Pain is worse on the top of the foot. He is wanting xray's. Walking makes pain worse. He is using a cane and can not walk without it. His PCP thinks it is related to his back and sciatica. He has an appointment with podiatry in later August.

## 2018-04-24 DIAGNOSIS — S86212A Strain of muscle(s) and tendon(s) of anterior muscle group at lower leg level, left leg, initial encounter: Secondary | ICD-10-CM | POA: Diagnosis not present

## 2018-04-28 DIAGNOSIS — M25572 Pain in left ankle and joints of left foot: Secondary | ICD-10-CM | POA: Diagnosis not present

## 2018-04-28 DIAGNOSIS — S86212A Strain of muscle(s) and tendon(s) of anterior muscle group at lower leg level, left leg, initial encounter: Secondary | ICD-10-CM | POA: Diagnosis not present

## 2018-04-28 DIAGNOSIS — M25472 Effusion, left ankle: Secondary | ICD-10-CM | POA: Diagnosis not present

## 2018-05-01 DIAGNOSIS — S86212D Strain of muscle(s) and tendon(s) of anterior muscle group at lower leg level, left leg, subsequent encounter: Secondary | ICD-10-CM | POA: Diagnosis not present

## 2018-05-24 DIAGNOSIS — Z6836 Body mass index (BMI) 36.0-36.9, adult: Secondary | ICD-10-CM | POA: Diagnosis not present

## 2018-05-24 DIAGNOSIS — R635 Abnormal weight gain: Secondary | ICD-10-CM | POA: Diagnosis not present

## 2018-05-24 DIAGNOSIS — I10 Essential (primary) hypertension: Secondary | ICD-10-CM | POA: Diagnosis not present

## 2018-05-26 DIAGNOSIS — I10 Essential (primary) hypertension: Secondary | ICD-10-CM | POA: Diagnosis not present

## 2018-06-22 DIAGNOSIS — R635 Abnormal weight gain: Secondary | ICD-10-CM | POA: Diagnosis not present

## 2018-06-22 DIAGNOSIS — Z6836 Body mass index (BMI) 36.0-36.9, adult: Secondary | ICD-10-CM | POA: Diagnosis not present

## 2018-06-22 DIAGNOSIS — Z1339 Encounter for screening examination for other mental health and behavioral disorders: Secondary | ICD-10-CM | POA: Diagnosis not present

## 2018-06-22 DIAGNOSIS — I10 Essential (primary) hypertension: Secondary | ICD-10-CM | POA: Diagnosis not present

## 2018-06-22 DIAGNOSIS — Z23 Encounter for immunization: Secondary | ICD-10-CM | POA: Diagnosis not present

## 2018-07-21 DIAGNOSIS — M549 Dorsalgia, unspecified: Secondary | ICD-10-CM | POA: Diagnosis not present

## 2018-07-21 DIAGNOSIS — R635 Abnormal weight gain: Secondary | ICD-10-CM | POA: Diagnosis not present

## 2018-07-21 DIAGNOSIS — I10 Essential (primary) hypertension: Secondary | ICD-10-CM | POA: Diagnosis not present

## 2018-07-21 DIAGNOSIS — Z6835 Body mass index (BMI) 35.0-35.9, adult: Secondary | ICD-10-CM | POA: Diagnosis not present

## 2018-07-27 DIAGNOSIS — E7800 Pure hypercholesterolemia, unspecified: Secondary | ICD-10-CM | POA: Diagnosis not present

## 2018-07-27 DIAGNOSIS — F410 Panic disorder [episodic paroxysmal anxiety] without agoraphobia: Secondary | ICD-10-CM | POA: Diagnosis not present

## 2018-07-27 DIAGNOSIS — M519 Unspecified thoracic, thoracolumbar and lumbosacral intervertebral disc disorder: Secondary | ICD-10-CM | POA: Diagnosis not present

## 2018-07-27 DIAGNOSIS — Z0000 Encounter for general adult medical examination without abnormal findings: Secondary | ICD-10-CM | POA: Diagnosis not present

## 2018-07-27 DIAGNOSIS — I10 Essential (primary) hypertension: Secondary | ICD-10-CM | POA: Diagnosis not present

## 2018-07-27 DIAGNOSIS — Z1331 Encounter for screening for depression: Secondary | ICD-10-CM | POA: Diagnosis not present

## 2018-07-27 DIAGNOSIS — C61 Malignant neoplasm of prostate: Secondary | ICD-10-CM | POA: Diagnosis not present

## 2018-09-26 DIAGNOSIS — M5030 Other cervical disc degeneration, unspecified cervical region: Secondary | ICD-10-CM | POA: Diagnosis not present

## 2018-10-10 DIAGNOSIS — R635 Abnormal weight gain: Secondary | ICD-10-CM | POA: Diagnosis not present

## 2018-10-10 DIAGNOSIS — I10 Essential (primary) hypertension: Secondary | ICD-10-CM | POA: Diagnosis not present

## 2018-10-10 DIAGNOSIS — Z6835 Body mass index (BMI) 35.0-35.9, adult: Secondary | ICD-10-CM | POA: Diagnosis not present

## 2018-10-10 DIAGNOSIS — M549 Dorsalgia, unspecified: Secondary | ICD-10-CM | POA: Diagnosis not present

## 2019-04-12 ENCOUNTER — Ambulatory Visit
Admission: RE | Admit: 2019-04-12 | Discharge: 2019-04-12 | Disposition: A | Discharge status: Home / Self Care | Payer: Medicare Other | Source: Ambulatory Visit | Attending: Infectious Diseases | Admitting: Infectious Diseases

## 2019-04-12 ENCOUNTER — Other Ambulatory Visit: Payer: Self-pay | Admitting: Infectious Diseases

## 2019-04-12 ENCOUNTER — Other Ambulatory Visit: Payer: Self-pay

## 2019-04-12 DIAGNOSIS — R1032 Left lower quadrant pain: Secondary | ICD-10-CM | POA: Diagnosis present

## 2019-11-26 IMAGING — CR DG FOOT COMPLETE 3+V*L*
3 series · 3 of 3 positions shown · non-contrast
Comparison: None.

CLINICAL DATA: Pain on the foot over navicular region for 4 weeks
without trauma.

EXAM:
LEFT FOOT - COMPLETE 3+ VIEW

[foot ap]
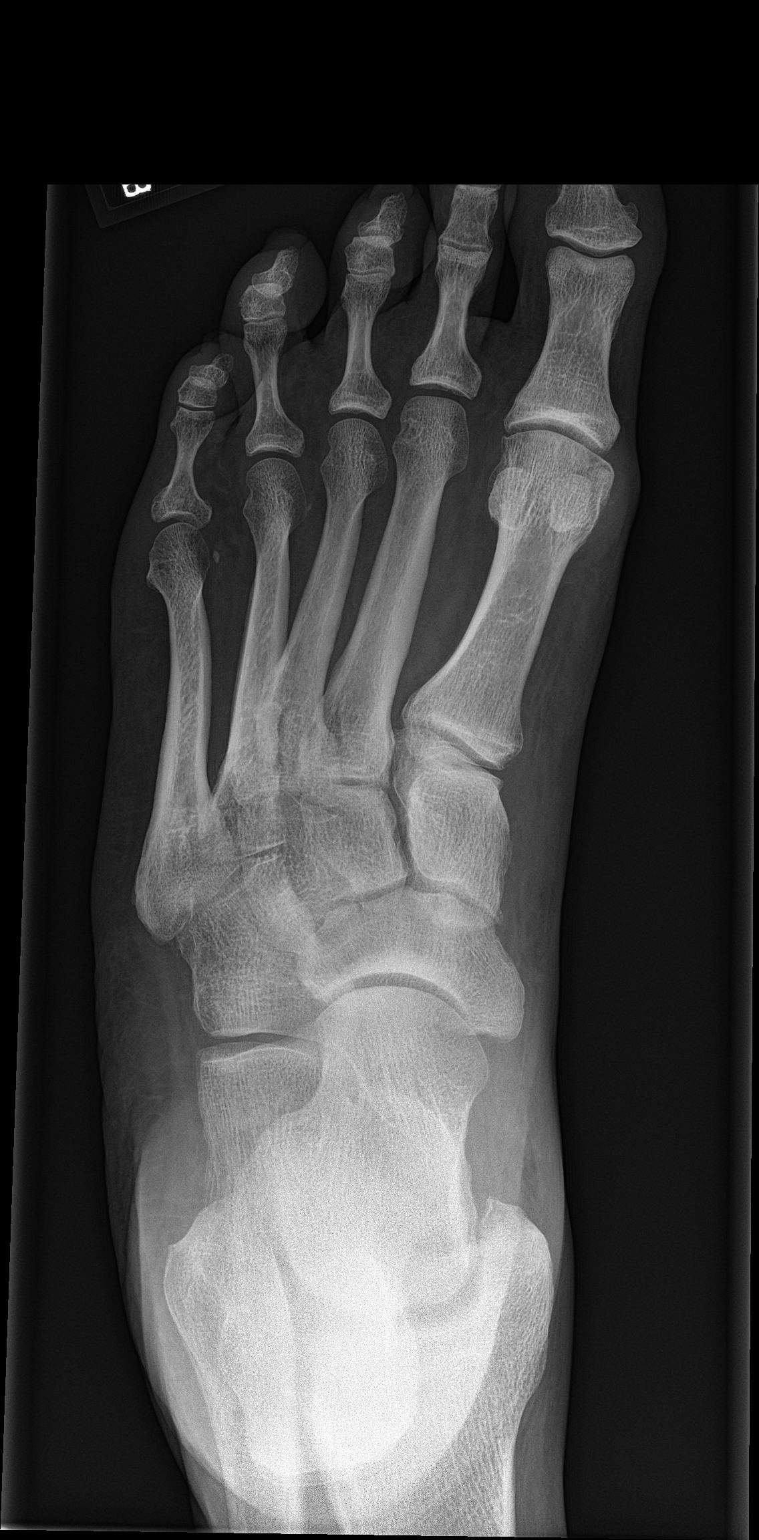

[foot obl]
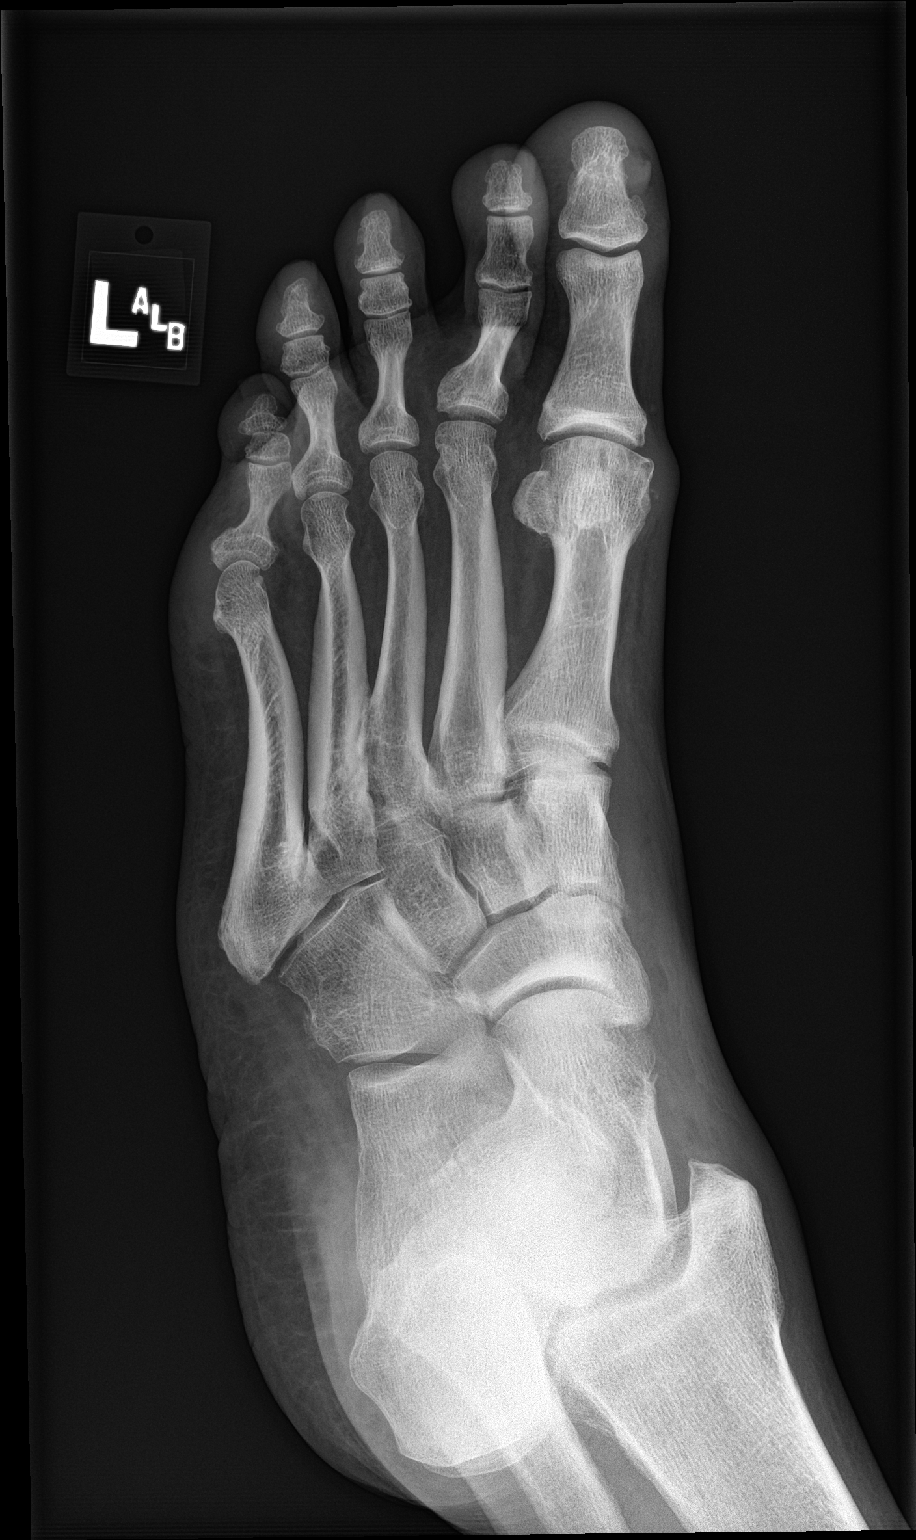

[foot lat]
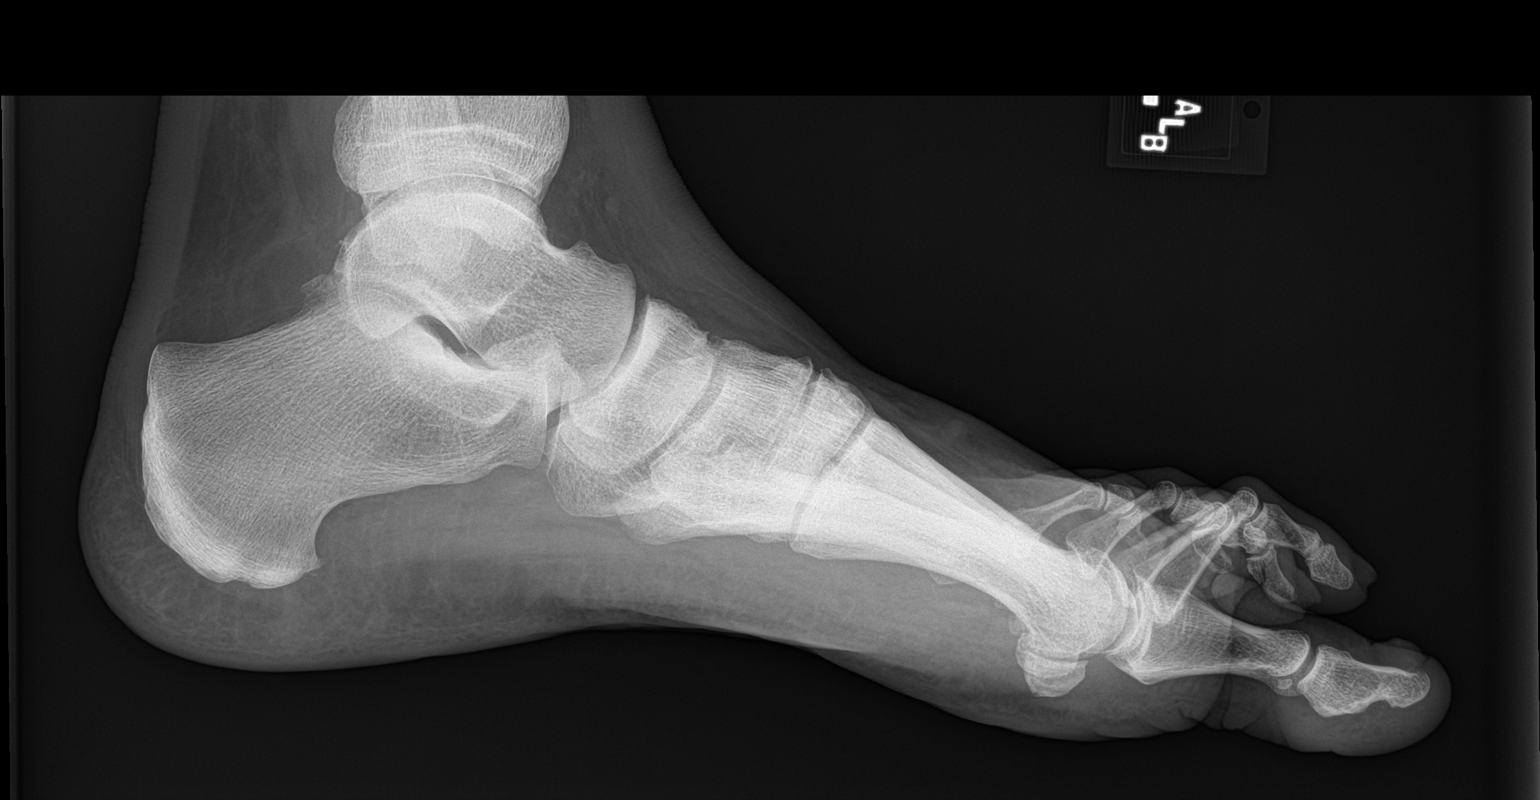

[3 of 3 positions shown; findings below may reference images not displayed]

FINDINGS: There is no evidence of fracture or dislocation. There is no
evidence of arthropathy or other focal bone abnormality. Soft
tissues are unremarkable.
IMPRESSION: Negative.

## 2020-11-17 IMAGING — CT CT RENAL STONE PROTOCOL
2 of 4 series · 16 of 46 positions shown, 18 images · non-contrast
Comparison: CT scan 11/03/2016

CLINICAL DATA: Left flank pain for 3 weeks. Prior history of
prostate cancer.

EXAM:
CT ABDOMEN AND PELVIS WITHOUT CONTRAST
TECHNIQUE: Multidetector CT imaging of the abdomen and pelvis was performed
following the standard protocol without IV contrast.

[Series 2: stone full standard (person_name) · axial · 0.80mm/px · z∈[-515,-85]mm · 13 of 96 slices shown, 15 images]
[im 5/96  soft-tissue]
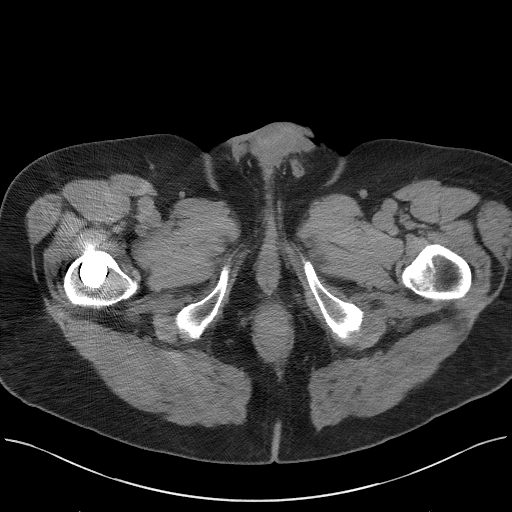
[im 5/96  bone]
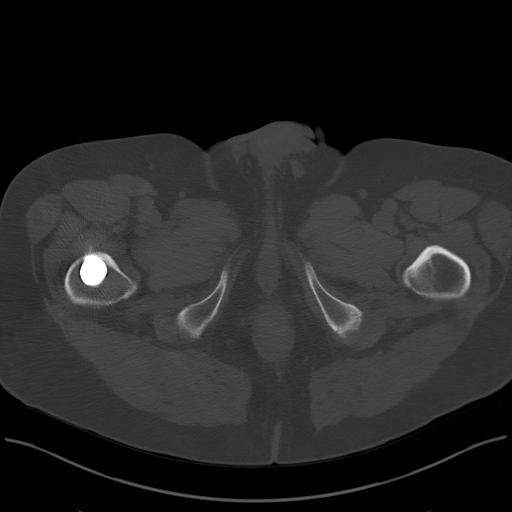
[im 13/96  soft-tissue]
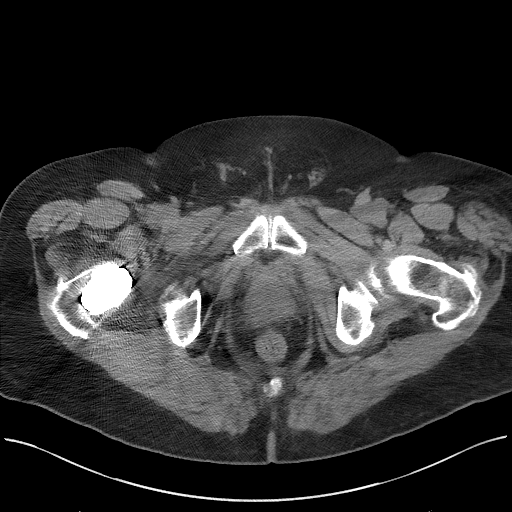
[im 21/96  soft-tissue]
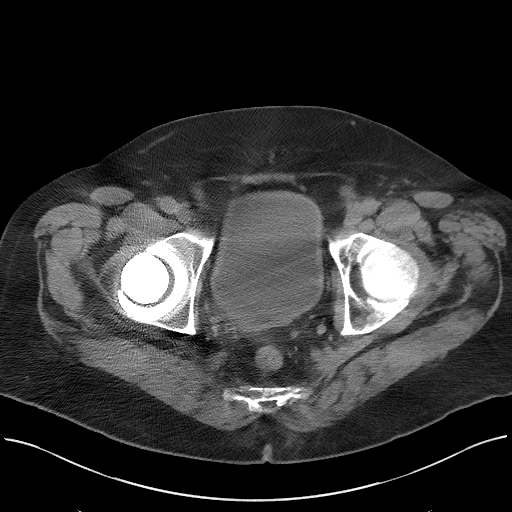
[im 25/96  soft-tissue]
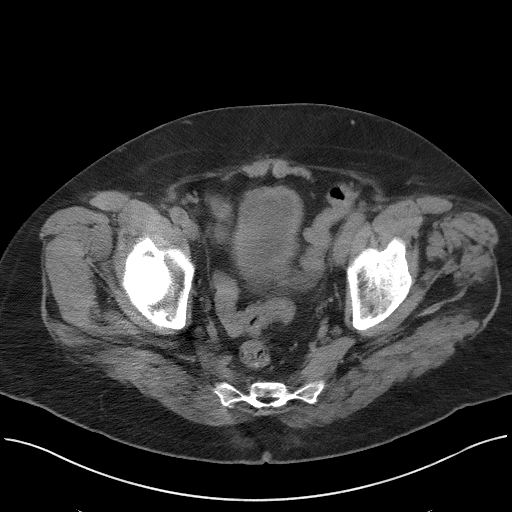
[im 34/96  soft-tissue]
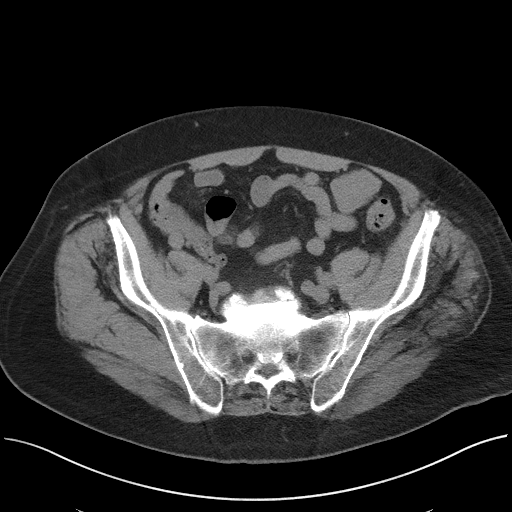
[im 42/96  soft-tissue]
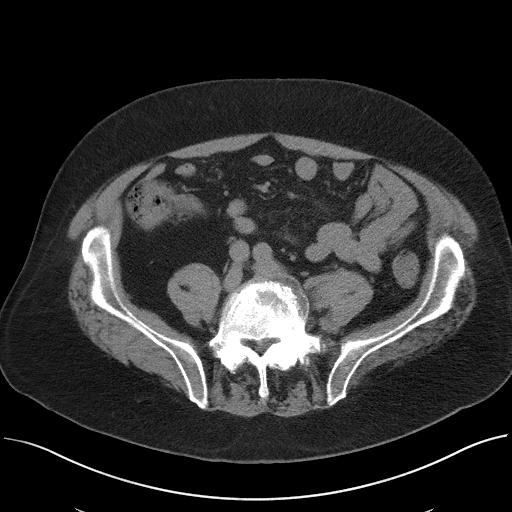
[im 50/96  soft-tissue]
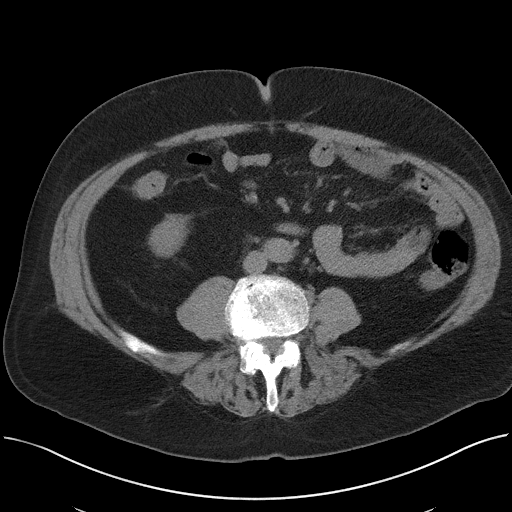
[im 54/96  soft-tissue]
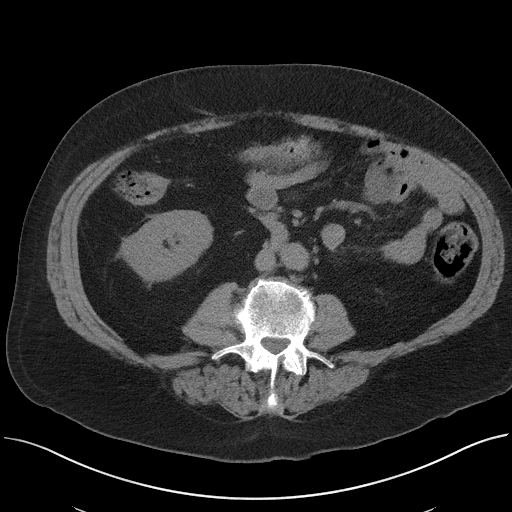
[im 62/96  soft-tissue]
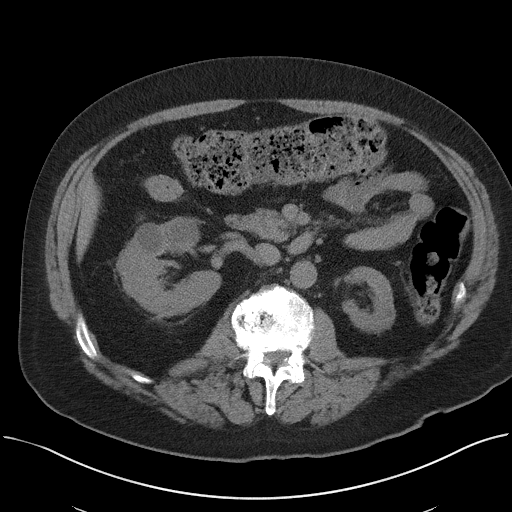
[im 62/96  bone]
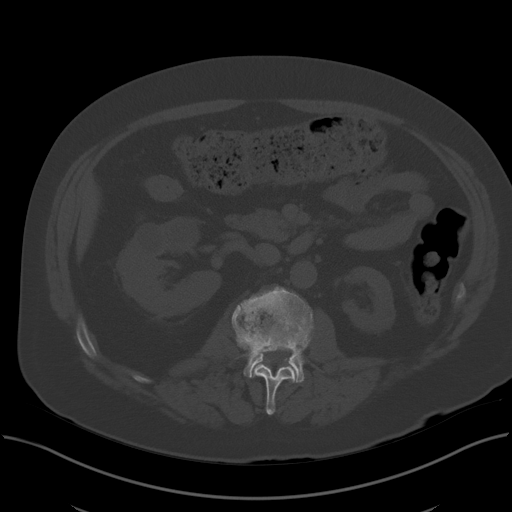
[im 71/96  soft-tissue]
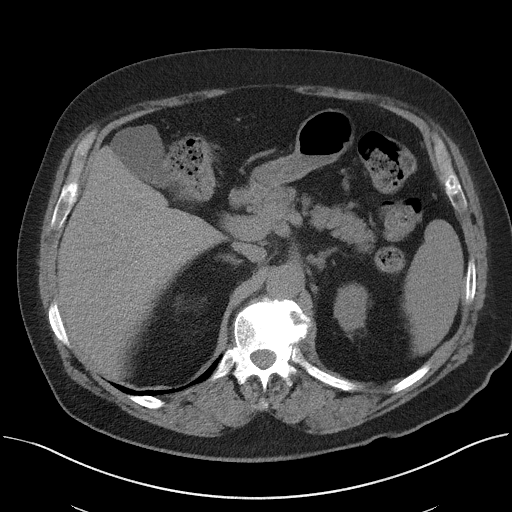
[im 75/96  soft-tissue]
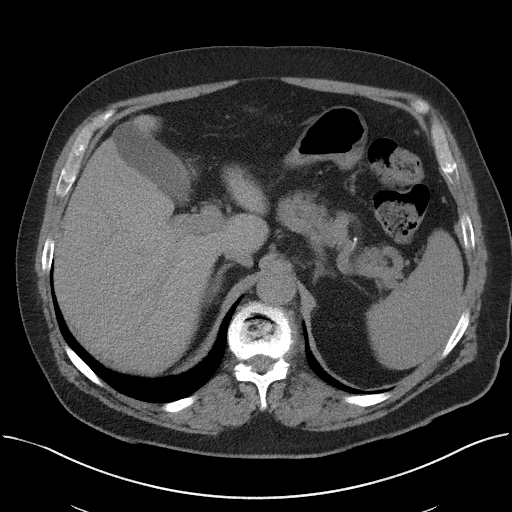
[im 83/96  soft-tissue]
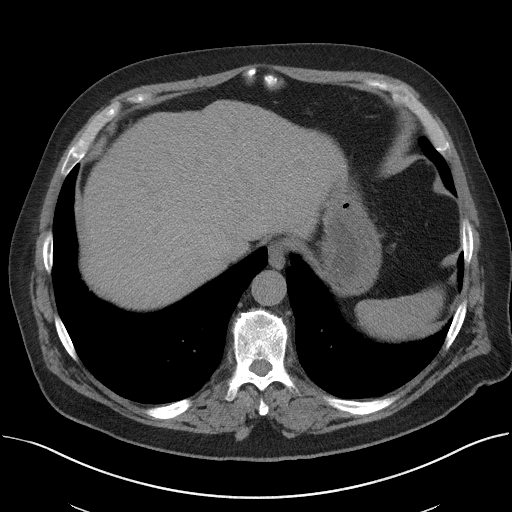
[im 91/96  soft-tissue]
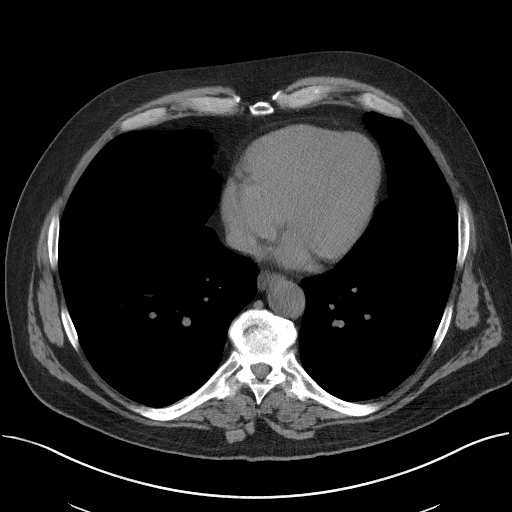

[Series 5: coronal · coronal · 0.91mm/px · 3 of 171 slices shown]
[im 57/171  soft-tissue]
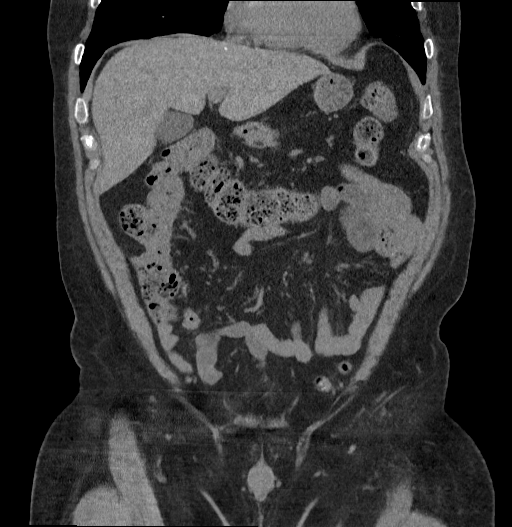
[im 76/171  soft-tissue]
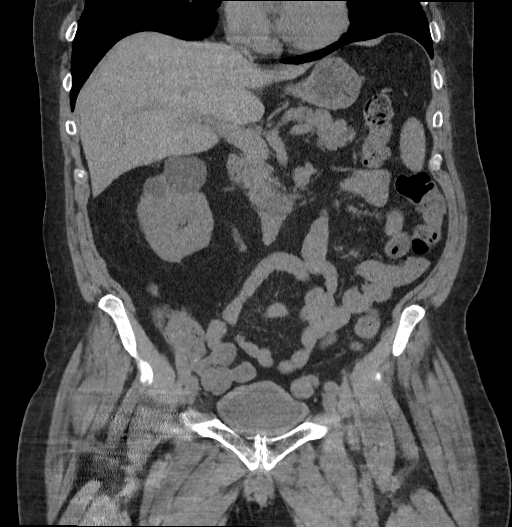
[im 95/171  soft-tissue]
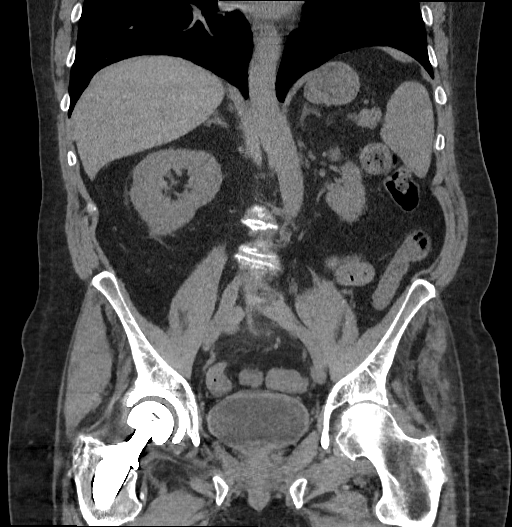

[16 of 46 positions shown; findings below may reference images not displayed]

FINDINGS: Lower chest: The lung bases are clear. No acute pulmonary findings
or worrisome pulmonary nodules. No pleural effusions. The heart is
normal in size. The distal esophagus is grossly normal.

Hepatobiliary: No focal hepatic lesions are identified without
contrast. No intrahepatic biliary dilatation. The gallbladder
appears normal. No common bile duct dilatation.

Pancreas: No mass, inflammation or ductal dilatation.

Spleen: Normal size.  No focal lesions.

Adrenals/Urinary Tract: Small bilateral thyroid nodules are stable.
Stable calcification associated with the left adrenal gland.

The left kidney is small. The right kidney demonstrates mild
compensatory hypertrophy. There are several simple appearing right
renal cyst. No worrisome renal lesions. No hydroureteronephrosis. No
renal or obstructing ureteral calculi or bladder calculi.

The bladder is grossly normal. There is moderate artifact through
the pelvis due to a right hip prosthesis but I do not see any
asymmetric bladder wall thickening or bladder mass.

Stomach/Bowel: The stomach, duodenum, small bowel and colon are
grossly normal without oral contrast. No inflammatory changes, mass
lesions or obstructive findings. The terminal ileum and appendix are
normal. Scattered colonic diverticulosis but no findings for acute
diverticulitis.

Vascular/Lymphatic: Minimal scattered atherosclerotic calcifications
involving the aorta and iliac arteries. No aneurysm. No mesenteric
or retroperitoneal mass or adenopathy.

Reproductive: Brachytherapy seeds are noted in the prostate gland.

Other: No pelvic mass or pelvic lymphadenopathy. No inguinal mass or
adenopathy.

Musculoskeletal: Age advanced degenerative changes involving the
spine but no acute bony findings or evidence of osseous metastatic
disease.
IMPRESSION: 1. No acute abdominal/pelvic findings, mass lesions or adenopathy
identified without IV or oral contrast.
2. No renal, ureteral or bladder calculi or mass.
3. Numerous renal cysts. No worrisome renal lesions and no obvious
bladder lesions.
4. Markedly age advanced degenerative changes involving the spine
but no findings for osseous metastatic disease.

## 2023-04-18 ENCOUNTER — Inpatient Hospital Stay (HOSPITAL_COMMUNITY): Payer: Medicare Other

## 2023-04-18 ENCOUNTER — Other Ambulatory Visit: Payer: Self-pay

## 2023-04-18 ENCOUNTER — Encounter (HOSPITAL_COMMUNITY): Payer: Self-pay

## 2023-04-18 ENCOUNTER — Inpatient Hospital Stay (HOSPITAL_COMMUNITY)
Admission: EM | Admit: 2023-04-18 | Discharge: 2023-04-20 | DRG: 069 | Disposition: A | Discharge status: Home / Self Care | Payer: Medicare Other | Attending: Infectious Diseases | Admitting: Infectious Diseases

## 2023-04-18 ENCOUNTER — Emergency Department (HOSPITAL_COMMUNITY): Payer: Medicare Other

## 2023-04-18 DIAGNOSIS — I129 Hypertensive chronic kidney disease with stage 1 through stage 4 chronic kidney disease, or unspecified chronic kidney disease: Secondary | ICD-10-CM | POA: Diagnosis not present

## 2023-04-18 DIAGNOSIS — N1831 Chronic kidney disease, stage 3a: Secondary | ICD-10-CM | POA: Diagnosis not present

## 2023-04-18 DIAGNOSIS — N179 Acute kidney failure, unspecified: Secondary | ICD-10-CM | POA: Diagnosis not present

## 2023-04-18 DIAGNOSIS — Z7982 Long term (current) use of aspirin: Secondary | ICD-10-CM

## 2023-04-18 DIAGNOSIS — Z79899 Other long term (current) drug therapy: Secondary | ICD-10-CM | POA: Diagnosis not present

## 2023-04-18 DIAGNOSIS — Z8551 Personal history of malignant neoplasm of bladder: Secondary | ICD-10-CM

## 2023-04-18 DIAGNOSIS — W1830XA Fall on same level, unspecified, initial encounter: Secondary | ICD-10-CM | POA: Diagnosis present

## 2023-04-18 DIAGNOSIS — Z720 Tobacco use: Secondary | ICD-10-CM

## 2023-04-18 DIAGNOSIS — G459 Transient cerebral ischemic attack, unspecified: Secondary | ICD-10-CM | POA: Diagnosis present

## 2023-04-18 DIAGNOSIS — J439 Emphysema, unspecified: Secondary | ICD-10-CM | POA: Diagnosis present

## 2023-04-18 DIAGNOSIS — Z8546 Personal history of malignant neoplasm of prostate: Secondary | ICD-10-CM | POA: Diagnosis not present

## 2023-04-18 DIAGNOSIS — Z79891 Long term (current) use of opiate analgesic: Secondary | ICD-10-CM | POA: Diagnosis not present

## 2023-04-18 DIAGNOSIS — Z923 Personal history of irradiation: Secondary | ICD-10-CM

## 2023-04-18 DIAGNOSIS — R4182 Altered mental status, unspecified: Secondary | ICD-10-CM

## 2023-04-18 DIAGNOSIS — I480 Paroxysmal atrial fibrillation: Secondary | ICD-10-CM | POA: Diagnosis not present

## 2023-04-18 DIAGNOSIS — Z7901 Long term (current) use of anticoagulants: Secondary | ICD-10-CM | POA: Diagnosis not present

## 2023-04-18 DIAGNOSIS — Z8673 Personal history of transient ischemic attack (TIA), and cerebral infarction without residual deficits: Secondary | ICD-10-CM

## 2023-04-18 DIAGNOSIS — S0083XA Contusion of other part of head, initial encounter: Secondary | ICD-10-CM | POA: Diagnosis not present

## 2023-04-18 DIAGNOSIS — F410 Panic disorder [episodic paroxysmal anxiety] without agoraphobia: Secondary | ICD-10-CM | POA: Diagnosis not present

## 2023-04-18 DIAGNOSIS — R4701 Aphasia: Secondary | ICD-10-CM | POA: Diagnosis not present

## 2023-04-18 DIAGNOSIS — E785 Hyperlipidemia, unspecified: Secondary | ICD-10-CM

## 2023-04-18 LAB — LIPID PANEL
Cholesterol: 197 mg/dL (ref 0–200)
HDL: 103 mg/dL (ref >40)
LDL Cholesterol: 78 mg/dL (ref 0–99)
Total CHOL/HDL Ratio: 1.9 RATIO
Triglycerides: 82 mg/dL (ref <150)
VLDL: 16 mg/dL (ref 0–40)

## 2023-04-18 LAB — APTT: aPTT: 32 seconds (ref 24–36)

## 2023-04-18 LAB — COMPREHENSIVE METABOLIC PANEL
ALT: 46 U/L — ABNORMAL HIGH (ref 0–44)
AST: 49 U/L — ABNORMAL HIGH (ref 15–41)
Albumin: 3.9 g/dL (ref 3.5–5.0)
Alkaline Phosphatase: 78 U/L (ref 38–126)
Anion gap: 13 (ref 5–15)
BUN: 25 mg/dL — ABNORMAL HIGH (ref 8–23)
CO2: 19 mmol/L — ABNORMAL LOW (ref 22–32)
Calcium: 8.9 mg/dL (ref 8.9–10.3)
Chloride: 101 mmol/L (ref 98–111)
Creatinine, Ser: 1.55 mg/dL — ABNORMAL HIGH (ref 0.61–1.24)
GFR, Estimated: 47 mL/min — ABNORMAL LOW (ref >60)
Glucose, Bld: 97 mg/dL (ref 70–99)
Potassium: 4.1 mmol/L (ref 3.5–5.1)
Sodium: 133 mmol/L — ABNORMAL LOW (ref 135–145)
Total Bilirubin: 0.7 mg/dL (ref 0.3–1.2)
Total Protein: 6.3 g/dL — ABNORMAL LOW (ref 6.5–8.1)

## 2023-04-18 LAB — CBC
HCT: 47.1 % (ref 39.0–52.0)
Hemoglobin: 15.3 g/dL (ref 13.0–17.0)
MCH: 32.9 pg (ref 26.0–34.0)
MCHC: 32.5 g/dL (ref 30.0–36.0)
MCV: 101.3 fL — ABNORMAL HIGH (ref 80.0–100.0)
Platelets: 204 10*3/uL (ref 150–400)
RBC: 4.65 MIL/uL (ref 4.22–5.81)
RDW: 12.5 % (ref 11.5–15.5)
WBC: 10 10*3/uL (ref 4.0–10.5)
nRBC: 0 % (ref 0.0–0.2)

## 2023-04-18 LAB — DIFFERENTIAL
Abs Immature Granulocytes: 0.06 10*3/uL (ref 0.00–0.07)
Basophils Absolute: 0 10*3/uL (ref 0.0–0.1)
Basophils Relative: 0 %
Eosinophils Absolute: 0.1 10*3/uL (ref 0.0–0.5)
Eosinophils Relative: 1 %
Immature Granulocytes: 1 %
Lymphocytes Relative: 20 %
Lymphs Abs: 2 10*3/uL (ref 0.7–4.0)
Monocytes Absolute: 0.8 10*3/uL (ref 0.1–1.0)
Monocytes Relative: 8 %
Neutro Abs: 7 10*3/uL (ref 1.7–7.7)
Neutrophils Relative %: 70 %

## 2023-04-18 LAB — I-STAT CHEM 8, ED
BUN: 27 mg/dL — ABNORMAL HIGH (ref 8–23)
Calcium, Ion: 1.21 mmol/L (ref 1.15–1.40)
Chloride: 102 mmol/L (ref 98–111)
Creatinine, Ser: 1.5 mg/dL — ABNORMAL HIGH (ref 0.61–1.24)
Glucose, Bld: 95 mg/dL (ref 70–99)
HCT: 46 % (ref 39.0–52.0)
Hemoglobin: 15.6 g/dL (ref 13.0–17.0)
Potassium: 4.2 mmol/L (ref 3.5–5.1)
Sodium: 135 mmol/L (ref 135–145)
TCO2: 24 mmol/L (ref 22–32)

## 2023-04-18 LAB — PROTIME-INR
INR: 1 (ref 0.8–1.2)
Prothrombin Time: 13.9 seconds (ref 11.4–15.2)

## 2023-04-18 LAB — CBG MONITORING, ED: Glucose-Capillary: 91 mg/dL (ref 70–99)

## 2023-04-18 LAB — ETHANOL: Alcohol, Ethyl (B): <10 mg/dL (ref <10)

## 2023-04-18 MED ORDER — ASPIRIN 81 MG PO TBEC
162.0000 mg | DELAYED_RELEASE_TABLET | Freq: Every day | ORAL | Status: DC
  Administered 2023-04-18 – 2023-04-19 (×2): 162 mg via ORAL
  Filled 2023-04-18 (×2): qty 2

## 2023-04-18 MED ORDER — GADOBUTROL 1 MMOL/ML IV SOLN: 10.0000 mL | Freq: Once | INTRAVENOUS | Status: DC | PRN

## 2023-04-18 MED ORDER — DULOXETINE HCL 60 MG PO CPEP
90.0000 mg | ORAL_CAPSULE | Freq: Every day | ORAL | Status: DC
  Administered 2023-04-18 – 2023-04-20 (×3): 90 mg via ORAL
  Filled 2023-04-18 (×3): qty 1

## 2023-04-18 MED ORDER — LISINOPRIL 20 MG PO TABS
20.0000 mg | ORAL_TABLET | Freq: Every day | ORAL | Status: DC
  Administered 2023-04-18 – 2023-04-20 (×3): 20 mg via ORAL
  Filled 2023-04-18 (×3): qty 1

## 2023-04-18 MED ORDER — ALPRAZOLAM 0.25 MG PO TABS: 0.5000 mg | ORAL_TABLET | Freq: Every day | ORAL | Status: DC | PRN

## 2023-04-18 MED ORDER — FUROSEMIDE 40 MG PO TABS
40.0000 mg | ORAL_TABLET | Freq: Every day | ORAL | Status: DC
  Administered 2023-04-18 – 2023-04-20 (×3): 40 mg via ORAL
  Filled 2023-04-18 (×3): qty 1

## 2023-04-18 MED ORDER — LACTATED RINGERS IV BOLUS
1000.0000 mL | Freq: Once | INTRAVENOUS | Status: AC
  Administered 2023-04-18: 1000 mL via INTRAVENOUS

## 2023-04-18 MED ORDER — GABAPENTIN 100 MG PO CAPS
100.0000 mg | ORAL_CAPSULE | Freq: Three times a day (TID) | ORAL | Status: DC
  Administered 2023-04-18 – 2023-04-20 (×5): 100 mg via ORAL
  Filled 2023-04-18 (×5): qty 1

## 2023-04-18 MED ORDER — ACETAMINOPHEN 325 MG PO TABS
650.0000 mg | ORAL_TABLET | Freq: Four times a day (QID) | ORAL | Status: DC | PRN
  Administered 2023-04-18: 650 mg via ORAL
  Filled 2023-04-18: qty 2

## 2023-04-18 MED ORDER — IOHEXOL 350 MG/ML SOLN
75.0000 mL | Freq: Once | INTRAVENOUS | Status: AC | PRN
  Administered 2023-04-18: 75 mL via INTRAVENOUS

## 2023-04-18 MED ORDER — LORAZEPAM 2 MG/ML IJ SOLN
1.0000 mg | Freq: Once | INTRAMUSCULAR | Status: AC | PRN
  Administered 2023-04-18: 1 mg via INTRAVENOUS
  Filled 2023-04-18: qty 1

## 2023-04-18 NOTE — Hospital Course (Signed)
Was having trouble saying things. Has had little spells before, but nothing this bad. Happened around 10AM. Was talking to his son on the phone. Denies any weakness in any part of his body. Denies any recent fevers, chills.   AAOx4   Xanax 1mg   Duloxetine 60mg   Lasix 40mg   Gabapentin 100mg   Lisinopril 20mg   Ambien 10mg  daily   Surgical History:  No recent surgeries  Hip replacement in 2011  30 years ago smoked cigarrettes years ago  Occasionally drinks alcohol, mainly wine  Lives by himself,  Independent in all adls/iadls  PCP: Dr. Letitia Libra    ALPRAZolam Prudy Feeler) 1 MG tablet Take 1 tablet (1 mg total) by mouth 3 (three) times daily as needed for Anxiety for up to 120 days 90 tablet 3 apixaban (ELIQUIS) 5 mg tablet Take by mouth cyclobenzaprine (FLEXERIL) 10 MG tablet Take 1 tablet (10 mg total) by mouth 3 (three) times daily as needed for Muscle spasms 30 tablet 2 DULoxetine (CYMBALTA) 60 MG DR capsule Take 1 capsule (60 mg total) by mouth once daily 90 capsule 0 gabapentin (NEURONTIN) 300 MG capsule Take 1 capsule (300 mg total) by mouth 3 (three) times daily 270 capsule 1 HYDROcodone-acetaminophen (NORCO) 10-325 mg tablet Take 1 tablet by mouth every 12 (twelve) hours as needed for Pain for up to 30 days 60 tablet 0 lisinopriL (ZESTRIL) 20 MG tablet TAKE 1 TABLET BY MOUTH DAILY 90 tablet 1 metoprolol succinate (TOPROL-XL) 25 MG XL tablet Take 1 tablet by mouth once daily tadalafiL (CIALIS) 10 MG tablet Take 1 tablet (10 mg total) by mouth once daily as needed for Erectile Dysfunction Take dose 30-45 min prior to anticipated sexual activity. 10 tablet 5 zolpidem (AMBIEN) 10 mg tablet Take 1 tablet (10 mg total) by mouth at bedtime as needed for Sleep for up to 180 days 30 tablet 5

## 2023-04-18 NOTE — Progress Notes (Signed)
Pt with transport via w/c for MRI.

## 2023-04-18 NOTE — H&P (Incomplete)
Internal Medicine Teaching Service Attending Note Date: 04/18/2023  Patient name: Martin Golden  Medical record number: [id number]  Date of birth: 05/09/1951   I have seen and evaluated Martin Golden and discussed their care with the Residency Team.   72 yo M with hx of previous aphasic episode (pt unclear of date), parox afib (on eliquis), prostate ca (2014), bladder tumor resection (2022) is brought to ED by his son with worsening of his speech. In ED pt gets frustrated with trying to express himself, but is clear in his speech.  He denies other neuro sx such as weakness.  IN ED he had CT/CTA which did not show intracranial findings.  In ED, he failed his swallow eval.   Physical Exam: Blood pressure 102/80, pulse 74, temperature 98.3 F (36.8 C), temperature source Oral, resp. rate 15, height 5\' 11"  (1.803 m), weight 105.2 kg, SpO2 99%. General appearance: alert, cooperative, appears stated age, and no distress Head: Normocephalic, without obvious abnormality, atraumatic Eyes: negative findings: pupils equal, round, reactive to light and accomodation Throat: abnormal findings: dry Neck: no adenopathy, no JVD, and supple, symmetrical, trachea midline Resp: clear to auscultation bilaterally Cardio: regular rate and rhythm GI: normal findings: bowel sounds normal and soft, non-tender Extremities: edema none Neurologic: Grossly normal  Lab results: Results for orders placed or performed during the hospital encounter of 04/18/23 (from the past 24 hour(s))  CBG monitoring, ED     Status: None   Collection Time: 04/18/23 12:11 PM  Result Value Ref Range   Glucose-Capillary 91 70 - 99 mg/dL  Ethanol     Status: None   Collection Time: 04/18/23 12:14 PM  Result Value Ref Range   Alcohol, Ethyl (B) <10 <10 mg/dL  Protime-INR     Status: None   Collection Time: 04/18/23 12:14 PM  Result Value Ref Range   Prothrombin Time 13.9 11.4 - 15.2 seconds   INR 1.0 0.8 - 1.2  APTT     Status:  None   Collection Time: 04/18/23 12:14 PM  Result Value Ref Range   aPTT 32 24 - 36 seconds  CBC     Status: Abnormal   Collection Time: 04/18/23 12:14 PM  Result Value Ref Range   WBC 10.0 4.0 - 10.5 K/uL   RBC 4.65 4.22 - 5.81 MIL/uL   Hemoglobin 15.3 13.0 - 17.0 g/dL   HCT 95.6 21.3 - 08.6 %   MCV 101.3 (H) 80.0 - 100.0 fL   MCH 32.9 26.0 - 34.0 pg   MCHC 32.5 30.0 - 36.0 g/dL   RDW 57.8 46.9 - 62.9 %   Platelets 204 150 - 400 K/uL   nRBC 0.0 0.0 - 0.2 %  Differential     Status: None   Collection Time: 04/18/23 12:14 PM  Result Value Ref Range   Neutrophils Relative % 70 %   Neutro Abs 7.0 1.7 - 7.7 K/uL   Lymphocytes Relative 20 %   Lymphs Abs 2.0 0.7 - 4.0 K/uL   Monocytes Relative 8 %   Monocytes Absolute 0.8 0.1 - 1.0 K/uL   Eosinophils Relative 1 %   Eosinophils Absolute 0.1 0.0 - 0.5 K/uL   Basophils Relative 0 %   Basophils Absolute 0.0 0.0 - 0.1 K/uL   Immature Granulocytes 1 %   Abs Immature Granulocytes 0.06 0.00 - 0.07 K/uL  Comprehensive metabolic panel     Status: Abnormal   Collection Time: 04/18/23 12:14 PM  Result Value Ref Range   Sodium  133 (L) 135 - 145 mmol/L   Potassium 4.1 3.5 - 5.1 mmol/L   Chloride 101 98 - 111 mmol/L   CO2 19 (L) 22 - 32 mmol/L   Glucose, Bld 97 70 - 99 mg/dL   BUN 25 (H) 8 - 23 mg/dL   Creatinine, Ser 1.61 (H) 0.61 - 1.24 mg/dL   Calcium 8.9 8.9 - 09.6 mg/dL   Total Protein 6.3 (L) 6.5 - 8.1 g/dL   Albumin 3.9 3.5 - 5.0 g/dL   AST 49 (H) 15 - 41 U/L   ALT 46 (H) 0 - 44 U/L   Alkaline Phosphatase 78 38 - 126 U/L   Total Bilirubin 0.7 0.3 - 1.2 mg/dL   GFR, Estimated 47 (L) >60 mL/min   Anion gap 13 5 - 15  I-stat chem 8, ED     Status: Abnormal   Collection Time: 04/18/23 12:57 PM  Result Value Ref Range   Sodium 135 135 - 145 mmol/L   Potassium 4.2 3.5 - 5.1 mmol/L   Chloride 102 98 - 111 mmol/L   BUN 27 (H) 8 - 23 mg/dL   Creatinine, Ser 0.45 (H) 0.61 - 1.24 mg/dL   Glucose, Bld 95 70 - 99 mg/dL   Calcium, Ion  4.09 8.11 - 1.40 mmol/L   TCO2 24 22 - 32 mmol/L   Hemoglobin 15.6 13.0 - 17.0 g/dL   HCT 91.4 78.2 - 95.6 %    Imaging results:  CT ANGIO HEAD NECK W WO CM  Result Date: 04/18/2023 CLINICAL DATA:  Neuro deficit, acute, stroke suspected. EXAM: CT ANGIOGRAPHY HEAD AND NECK WITH AND WITHOUT CONTRAST TECHNIQUE: Multidetector CT imaging of the head and neck was performed using the standard protocol during bolus administration of intravenous contrast. Multiplanar CT image reconstructions and MIPs were obtained to evaluate the vascular anatomy. Carotid stenosis measurements (when applicable) are obtained utilizing NASCET criteria, using the distal internal carotid diameter as the denominator. RADIATION DOSE REDUCTION: This exam was performed according to the departmental dose-optimization program which includes automated exposure control, adjustment of the mA and/or kV according to patient size and/or use of iterative reconstruction technique. CONTRAST:  75mL OMNIPAQUE IOHEXOL 350 MG/ML SOLN COMPARISON:  Brain MRI 06/02/2022 FINDINGS: CT HEAD FINDINGS Brain: No visible acute infarction. The brainstem and cerebellum are normal. There are minimal small vessel changes of the hemispheric white matter and old small cortical/subcortical insult at the left frontal vertex. No mass, hemorrhage, hydrocephalus or extra-axial collection. Vascular: No abnormal vascular finding. Skull: Normal Sinuses/Orbits: Clear/normal Other: None Review of the MIP images confirms the above findings CTA NECK FINDINGS Aortic arch: Normal aortic arch.  Normal branching pattern. Right carotid system: Common carotid artery widely patent to the bifurcation. Minimal soft plaque at the bifurcation but no stenosis. Cervical ICA is normal. Left carotid system: Common carotid artery widely patent to the bifurcation. Minimal soft plaque at the bifurcation but no stenosis. Cervical ICA is normal. Vertebral arteries: Both vertebral artery origins are  widely patent. Both vertebral arteries appear normal through the cervical region to the foramen magnum. Skeleton: Ordinary spondylosis and facet arthropathy. Other neck: No mass or lymphadenopathy. Upper chest: Lung apices are clear. Review of the MIP images confirms the above findings CTA HEAD FINDINGS Anterior circulation: Both internal carotid arteries are patent through the skull base and siphon regions. No siphon stenosis. The anterior and middle cerebral vessels are patent. No large vessel occlusion or proximal stenosis. No aneurysm or vascular malformation. Posterior circulation: Both vertebral arteries  widely patent through the foramen magnum to the basilar artery. No basilar stenosis. Posterior circulation branch vessels are normal. Venous sinuses: Patent and normal. Anatomic variants: None significant. Review of the MIP images confirms the above findings IMPRESSION: 1. No acute CT finding. Minimal small vessel changes of the hemispheric white matter and old small cortical/subcortical insult at the left frontal vertex. 2. Minimal soft plaque at both carotid bifurcations but no stenosis. 3. Normal intracranial CTA. Electronically Signed   By: Paulina Fusi M.D.   On: 04/18/2023 13:48   DG Chest Portable 1 View  Result Date: 04/18/2023 CLINICAL DATA:  Altered mental status EXAM: PORTABLE CHEST 1 VIEW COMPARISON:  06/03/2022 FINDINGS: Bibasilar atelectasis versus scarring. Background COPD/emphysema suspected. Stable heart size and vascularity. Negative for edema, definite pneumonia, large effusion or pneumothorax. Trachea midline. Degenerative changes of the spine. IMPRESSION: 1. Bibasilar atelectasis versus scarring. 2. Suspect COPD/emphysema. Electronically Signed   By: Judie Petit.  Shick M.D.   On: 04/18/2023 12:34    Assessment and Plan: I agree with the formulated Assessment and Plan with the following changes:  TIA AKI/CKD 3a (Cr 0.9 08-2022) Previous Bladder CA Previous Prostate CA  Will check  TTE Check lipids Watch his BP and tele PT eval Consider neuro eval and MRI.  Watch his Cr after hydration.

## 2023-04-18 NOTE — ED Notes (Signed)
ED TO INPATIENT HANDOFF REPORT  ED Nurse Name and Phone #: [phone]  S Name/Age/Gender Martin Golden 72 y.o. male Room/Bed: 035C/035C  Code Status   Code Status: Full Code  Home/SNF/Other Home Patient oriented to: self, place, time, and situation Is this baseline? Yes   Triage Complete: Triage complete  Chief Complaint Transient ischemic attack [G45.9]  Triage Note Patient brought in by [facility] for stroke like symptoms. Patient was on the phone with credit union, with incompressible speech. Credit union staff called EMS. Patient was nonverbal upon arrival of EMS. Patient had difficulty following commands, with repetitive speech. Patient has difficulty with balance. Patient is anticoagulated, hx of TIA's. Patient is now alert and oriented x4. No dysarthria at this time, some expressive aphasia noted. Patient is noted to have bruising to face, family states this happened weeks ago. EMS vitals: 136/84 BP, 70 HR, 98 O2% RA, 16 RR. CBG 146.18 R AC    Allergies No Known Allergies  Level of Care/Admitting Diagnosis ED Disposition     ED Disposition  Admit   Condition  --   Comment  Hospital Area: [facility] [[id number]]  Level of Care: Telemetry Medical [104]  May admit patient to [facility] or [facility] if equivalent level of care is available:: Yes  Covid Evaluation: Asymptomatic - no recent exposure (last 10 days) testing not required  Diagnosis: Transient ischemic attack [295621]  Admitting Physician: Ginnie Smart [[id number]  Attending Physician: Ninetta Lights, JEFFREY C [[id number]]  Certification:: I certify this patient will need inpatient services for at least 2 midnights  Estimated Length of Stay: 3          B Medical/Surgery History Past Medical History:  Diagnosis Date   Bronchitis    Prostate cancer (HCC) 2014   had radiation treatments   Sinusitis    Past Surgical History:  Procedure Laterality Date   HIP SURGERY Right 2011      A IV Location/Drains/Wounds Patient Lines/Drains/Airways Status     Active Line/Drains/Airways     Name Placement date Placement time Site Days   Peripheral IV 04/18/23 18 G Right Antecubital 04/18/23  1218  Antecubital  less than 1            Intake/Output Last 24 hours No intake or output data in the 24 hours ending 04/18/23 1651  Labs/Imaging Results for orders placed or performed during the hospital encounter of 04/18/23 (from the past 48 hour(s))  CBG monitoring, ED     Status: None   Collection Time: 04/18/23 12:11 PM  Result Value Ref Range   Glucose-Capillary 91 70 - 99 mg/dL    Comment: Glucose reference range applies only to samples taken after fasting for at least 8 hours.  Ethanol     Status: None   Collection Time: 04/18/23 12:14 PM  Result Value Ref Range   Alcohol, Ethyl (B) <10 <10 mg/dL    Comment: (NOTE) Lowest detectable limit for serum alcohol is 10 mg/dL.  For medical purposes only. Performed at [facility], [address], [place], [place] [address]   Protime-INR     Status: None   Collection Time: 04/18/23 12:14 PM  Result Value Ref Range   Prothrombin Time 13.9 11.4 - 15.2 seconds   INR 1.0 0.8 - 1.2    Comment: (NOTE) INR goal varies based on device and disease states. Performed at [facility], [address], [place], [place] [address]   APTT  Status: None   Collection Time: 04/18/23 12:14 PM  Result Value Ref Range   aPTT 32 24 - 36 seconds    Comment: Performed at [facility], [address], [place], [place] [address]  CBC     Status: Abnormal   Collection Time: 04/18/23 12:14 PM  Result Value Ref Range   WBC 10.0 4.0 - 10.5 K/uL   RBC 4.65 4.22 - 5.81 MIL/uL   Hemoglobin 15.3 13.0 - 17.0 g/dL   HCT 96.2 95.2 - 84.1 %   MCV 101.3 (H) 80.0 - 100.0 fL   MCH 32.9 26.0 - 34.0 pg   MCHC 32.5 30.0 - 36.0 g/dL   RDW 32.4 40.1 - 02.7 %   Platelets 204 150 - 400 K/uL   nRBC 0.0 0.0 - 0.2 %    Comment:  Performed at [facility], [address], [place], [place] [address]  Differential     Status: None   Collection Time: 04/18/23 12:14 PM  Result Value Ref Range   Neutrophils Relative % 70 %   Neutro Abs 7.0 1.7 - 7.7 K/uL   Lymphocytes Relative 20 %   Lymphs Abs 2.0 0.7 - 4.0 K/uL   Monocytes Relative 8 %   Monocytes Absolute 0.8 0.1 - 1.0 K/uL   Eosinophils Relative 1 %   Eosinophils Absolute 0.1 0.0 - 0.5 K/uL   Basophils Relative 0 %   Basophils Absolute 0.0 0.0 - 0.1 K/uL   Immature Granulocytes 1 %   Abs Immature Granulocytes 0.06 0.00 - 0.07 K/uL    Comment: Performed at [facility], [address] [place], [place] [address]  Comprehensive metabolic panel     Status: Abnormal   Collection Time: 04/18/23 12:14 PM  Result Value Ref Range   Sodium 133 (L) 135 - 145 mmol/L   Potassium 4.1 3.5 - 5.1 mmol/L   Chloride 101 98 - 111 mmol/L   CO2 19 (L) 22 - 32 mmol/L   Glucose, Bld 97 70 - 99 mg/dL    Comment: Glucose reference range applies only to samples taken after fasting for at least 8 hours.   BUN 25 (H) 8 - 23 mg/dL   Creatinine, Ser 7.42 (H) 0.61 - 1.24 mg/dL   Calcium 8.9 8.9 - 59.5 mg/dL   Total Protein 6.3 (L) 6.5 - 8.1 g/dL   Albumin 3.9 3.5 - 5.0 g/dL   AST 49 (H) 15 - 41 U/L   ALT 46 (H) 0 - 44 U/L   Alkaline Phosphatase 78 38 - 126 U/L   Total Bilirubin 0.7 0.3 - 1.2 mg/dL   GFR, Estimated 47 (L) >60 mL/min    Comment: (NOTE) Calculated using the CKD-EPI Creatinine Equation (2021)    Anion gap 13 5 - 15    Comment: Performed at [facility] [place] [facility], [address], [place], [place] [address]  I-stat chem 8, ED     Status: Abnormal   Collection Time: 04/18/23 12:57 PM  Result Value Ref Range   Sodium 135 135 - 145 mmol/L   Potassium 4.2 3.5 - 5.1 mmol/L   Chloride 102 98 - 111 mmol/L   BUN 27 (H) 8 - 23 mg/dL   Creatinine, Ser 6.43 (H) 0.61 - 1.24 mg/dL   Glucose, Bld 95 70 - 99 mg/dL    Comment: Glucose reference range applies only to  samples taken after fasting for at least 8 hours.   Calcium, Ion 1.21 1.15 -  1.40 mmol/L   TCO2 24 22 - 32 mmol/L   Hemoglobin 15.6 13.0 - 17.0 g/dL   HCT 16.1 09.6 - 04.5 %   CT ANGIO HEAD NECK W WO CM  Result Date: 04/18/2023 CLINICAL DATA:  Neuro deficit, acute, stroke suspected. EXAM: CT ANGIOGRAPHY HEAD AND NECK WITH AND WITHOUT CONTRAST TECHNIQUE: Multidetector CT imaging of the head and neck was performed using the standard protocol during bolus administration of intravenous contrast. Multiplanar CT image reconstructions and MIPs were obtained to evaluate the vascular anatomy. Carotid stenosis measurements (when applicable) are obtained utilizing NASCET criteria, using the distal internal carotid diameter as the denominator. RADIATION DOSE REDUCTION: This exam was performed according to the departmental dose-optimization program which includes automated exposure control, adjustment of the mA and/or kV according to patient size and/or use of iterative reconstruction technique. CONTRAST:  75mL OMNIPAQUE IOHEXOL 350 MG/ML SOLN COMPARISON:  Brain MRI 06/02/2022 FINDINGS: CT HEAD FINDINGS Brain: No visible acute infarction. The brainstem and cerebellum are normal. There are minimal small vessel changes of the hemispheric white matter and old small cortical/subcortical insult at the left frontal vertex. No mass, hemorrhage, hydrocephalus or extra-axial collection. Vascular: No abnormal vascular finding. Skull: Normal Sinuses/Orbits: Clear/normal Other: None Review of the MIP images confirms the above findings CTA NECK FINDINGS Aortic arch: Normal aortic arch.  Normal branching pattern. Right carotid system: Common carotid artery widely patent to the bifurcation. Minimal soft plaque at the bifurcation but no stenosis. Cervical ICA is normal. Left carotid system: Common carotid artery widely patent to the bifurcation. Minimal soft plaque at the bifurcation but no stenosis. Cervical ICA is normal. Vertebral  arteries: Both vertebral artery origins are widely patent. Both vertebral arteries appear normal through the cervical region to the foramen magnum. Skeleton: Ordinary spondylosis and facet arthropathy. Other neck: No mass or lymphadenopathy. Upper chest: Lung apices are clear. Review of the MIP images confirms the above findings CTA HEAD FINDINGS Anterior circulation: Both internal carotid arteries are patent through the skull base and siphon regions. No siphon stenosis. The anterior and middle cerebral vessels are patent. No large vessel occlusion or proximal stenosis. No aneurysm or vascular malformation. Posterior circulation: Both vertebral arteries widely patent through the foramen magnum to the basilar artery. No basilar stenosis. Posterior circulation branch vessels are normal. Venous sinuses: Patent and normal. Anatomic variants: None significant. Review of the MIP images confirms the above findings IMPRESSION: 1. No acute CT finding. Minimal small vessel changes of the hemispheric white matter and old small cortical/subcortical insult at the left frontal vertex. 2. Minimal soft plaque at both carotid bifurcations but no stenosis. 3. Normal intracranial CTA. Electronically Signed   By: Paulina Fusi M.D.   On: 04/18/2023 13:48   DG Chest Portable 1 View  Result Date: 04/18/2023 CLINICAL DATA:  Altered mental status EXAM: PORTABLE CHEST 1 VIEW COMPARISON:  06/03/2022 FINDINGS: Bibasilar atelectasis versus scarring. Background COPD/emphysema suspected. Stable heart size and vascularity. Negative for edema, definite pneumonia, large effusion or pneumothorax. Trachea midline. Degenerative changes of the spine. IMPRESSION: 1. Bibasilar atelectasis versus scarring. 2. Suspect COPD/emphysema. Electronically Signed   By: Judie Petit.  Shick M.D.   On: 04/18/2023 12:34    Pending Labs Unresulted Labs (From admission, onward)     Start     Ordered   04/18/23 1618  Lipid panel  Once,   R        04/18/23 1617    04/18/23 1618  Hemoglobin A1c  Once,   R  04/18/23 1617   04/18/23 1215  Urine rapid drug screen (hosp performed)  Once,   STAT        04/18/23 1215   04/18/23 1215  Urinalysis, Routine w reflex microscopic -Urine, Unspecified Source  Once,   URGENT       Question:  Specimen Source  Answer:  Urine, Unspecified Source   04/18/23 1215            Vitals/Pain Today's Vitals   04/18/23 1215 04/18/23 1220 04/18/23 1552 04/18/23 1632  BP:   102/80   Pulse:   74   Resp:   15   Temp:   98.3 F (36.8 C)   TempSrc:   Oral Oral  SpO2:   99%   Weight: 105.2 kg     Height: 5\' 11"  (1.803 m)     PainSc: 0-No pain 0-No pain      Isolation Precautions No active isolations  Medications Medications  ALPRAZolam (XANAX) tablet 0.5 mg (has no administration in time range)  aspirin EC tablet 162 mg (has no administration in time range)  DULoxetine (CYMBALTA) DR capsule 90 mg (has no administration in time range)  furosemide (LASIX) tablet 40 mg (has no administration in time range)  gabapentin (NEURONTIN) capsule 100 mg (has no administration in time range)  lisinopril (ZESTRIL) tablet 20 mg (has no administration in time range)  iohexol (OMNIPAQUE) 350 MG/ML injection 75 mL (75 mLs Intravenous Contrast Given 04/18/23 1336)    Mobility walks     Focused Assessments Neuro Assessment Handoff:  Swallow screen pass? No  Cardiac Rhythm: Normal sinus rhythm       Neuro Assessment:   Neuro Checks:      Has TPA been given? No If patient is a Neuro Trauma and patient is going to OR before floor call report to 4N Charge nurse: [phone] or [phone]   R Recommendations: See Admitting Provider Note  Report given to:   Additional Notes: patient is waiting for speech eval. He not pass the swallow screen.

## 2023-04-18 NOTE — ED Provider Notes (Signed)
[place] EMERGENCY DEPARTMENT AT [facility] Provider Note   CSN: [id number] Arrival date & time: 04/18/23  1205     History  No chief complaint on file.   Martin Golden is a 72 y.o. male.  HPI Patient brought in with mental status change.  Difficulty speaking.  Reportedly called his bank and then could not speak.  EMS had reported aphasia and difficulty walking around but is improving.  Answering questions now but still somewhat confused.  States he was really unable to talk because his memory was not there.  Unsure when last normal was had not spoken earlier today.   Past Medical History:  Diagnosis Date   Bronchitis    Prostate cancer (HCC) 2014   had radiation treatments   Sinusitis     Home Medications Prior to Admission medications   Medication Sig Start Date End Date Taking? Authorizing Provider  ALPRAZolam Prudy Feeler) 1 MG tablet Take 1 mg by mouth as needed for anxiety.    [provider]  aspirin EC 81 MG tablet Take 162 mg by mouth daily.    [provider]  DULoxetine (CYMBALTA) 60 MG capsule Take 90 mg by mouth daily.     [provider]  furosemide (LASIX) 40 MG tablet Take 40 mg by mouth daily.    [provider]  gabapentin (NEURONTIN) 100 MG capsule TAKE 1 CAPSULE BY MOUTH THREE TIMES A DAY 04/12/18   [provider]  HYDROcodone-acetaminophen (NORCO) 10-325 MG tablet Limit one tablet by mouth 2-3 times per day if tolerated 05/17/16   Ewing Schlein, MD  ibuprofen (ADVIL,MOTRIN) 200 MG tablet Take 200 mg by mouth 3 (three) times daily. Takes 3 tablets  Two to three times daily    [provider]  lisinopril (PRINIVIL,ZESTRIL) 20 MG tablet Take by mouth. 09/14/16   [provider]  phentermine 37.5 MG capsule Take 37.5 mg by mouth daily. Reported on 12/15/2015    [provider]  sildenafil (REVATIO) 20 MG tablet Take 2 to 5 tablets daily as needed 01/05/18   [provider]   zolpidem (AMBIEN) 10 MG tablet Take 10 mg by mouth at bedtime as needed for sleep.    [provider]      Allergies    Patient has no known allergies.    Review of Systems   Review of Systems  Physical Exam Updated Vital Signs BP 138/70 (BP Location: Left Arm)   Pulse 76   Temp 98 F (36.7 C) (Oral)   Resp (!) 24   Ht 5\' 11"  (1.803 m)   Wt 105.2 kg   SpO2 100%   BMI 32.36 kg/m  Physical Exam Vitals and nursing note reviewed.  HENT:     Head: Atraumatic.     Comments: Some bruising to right forehead.  Reportedly had fall a couple weeks ago. Eyes:     Extraocular Movements: Extraocular movements intact.  Cardiovascular:     Rate and Rhythm: Regular rhythm.  Musculoskeletal:     Cervical back: Neck supple.  Skin:    Capillary Refill: Capillary refill takes less than 2 seconds.  Neurological:     Mental Status: He is alert.     Comments: Awake and appropriate.  Answers questions.  Moves all extremities.  Equal breath strength bilaterally.  Does have some memory issues.  Some word finding.      ED Results / Procedures / Treatments   Labs (all labs ordered are listed, but only  abnormal results are displayed) Labs Reviewed  CBC - Abnormal; Notable for the following components:      Result Value   MCV 101.3 (*)    All other components within normal limits  COMPREHENSIVE METABOLIC PANEL - Abnormal; Notable for the following components:   Sodium 133 (*)    CO2 19 (*)    BUN 25 (*)    Creatinine, Ser 1.55 (*)    Total Protein 6.3 (*)    AST 49 (*)    ALT 46 (*)    GFR, Estimated 47 (*)    All other components within normal limits  I-STAT CHEM 8, ED - Abnormal; Notable for the following components:   BUN 27 (*)    Creatinine, Ser 1.50 (*)    All other components within normal limits  ETHANOL  PROTIME-INR  APTT  DIFFERENTIAL  RAPID URINE DRUG SCREEN, HOSP PERFORMED  URINALYSIS, ROUTINE W REFLEX MICROSCOPIC  CBG MONITORING, ED     EKG None  Radiology CT ANGIO HEAD NECK W WO CM  Result Date: 04/18/2023 CLINICAL DATA:  Neuro deficit, acute, stroke suspected. EXAM: CT ANGIOGRAPHY HEAD AND NECK WITH AND WITHOUT CONTRAST TECHNIQUE: Multidetector CT imaging of the head and neck was performed using the standard protocol during bolus administration of intravenous contrast. Multiplanar CT image reconstructions and MIPs were obtained to evaluate the vascular anatomy. Carotid stenosis measurements (when applicable) are obtained utilizing NASCET criteria, using the distal internal carotid diameter as the denominator. RADIATION DOSE REDUCTION: This exam was performed according to the departmental dose-optimization program which includes automated exposure control, adjustment of the mA and/or kV according to patient size and/or use of iterative reconstruction technique. CONTRAST:  75mL OMNIPAQUE IOHEXOL 350 MG/ML SOLN COMPARISON:  Brain MRI 06/02/2022 FINDINGS: CT HEAD FINDINGS Brain: No visible acute infarction. The brainstem and cerebellum are normal. There are minimal small vessel changes of the hemispheric white matter and old small cortical/subcortical insult at the left frontal vertex. No mass, hemorrhage, hydrocephalus or extra-axial collection. Vascular: No abnormal vascular finding. Skull: Normal Sinuses/Orbits: Clear/normal Other: None Review of the MIP images confirms the above findings CTA NECK FINDINGS Aortic arch: Normal aortic arch.  Normal branching pattern. Right carotid system: Common carotid artery widely patent to the bifurcation. Minimal soft plaque at the bifurcation but no stenosis. Cervical ICA is normal. Left carotid system: Common carotid artery widely patent to the bifurcation. Minimal soft plaque at the bifurcation but no stenosis. Cervical ICA is normal. Vertebral arteries: Both vertebral artery origins are widely patent. Both vertebral arteries appear normal through the cervical region to the foramen magnum.  Skeleton: Ordinary spondylosis and facet arthropathy. Other neck: No mass or lymphadenopathy. Upper chest: Lung apices are clear. Review of the MIP images confirms the above findings CTA HEAD FINDINGS Anterior circulation: Both internal carotid arteries are patent through the skull base and siphon regions. No siphon stenosis. The anterior and middle cerebral vessels are patent. No large vessel occlusion or proximal stenosis. No aneurysm or vascular malformation. Posterior circulation: Both vertebral arteries widely patent through the foramen magnum to the basilar artery. No basilar stenosis. Posterior circulation branch vessels are normal. Venous sinuses: Patent and normal. Anatomic variants: None significant. Review of the MIP images confirms the above findings IMPRESSION: 1. No acute CT finding. Minimal small vessel changes of the hemispheric white matter and old small cortical/subcortical insult at the left frontal vertex. 2. Minimal soft plaque at both carotid bifurcations but no stenosis. 3. Normal intracranial CTA. Electronically Signed  By: Paulina Fusi M.D.   On: 04/18/2023 13:48   DG Chest Portable 1 View  Result Date: 04/18/2023 CLINICAL DATA:  Altered mental status EXAM: PORTABLE CHEST 1 VIEW COMPARISON:  06/03/2022 FINDINGS: Bibasilar atelectasis versus scarring. Background COPD/emphysema suspected. Stable heart size and vascularity. Negative for edema, definite pneumonia, large effusion or pneumothorax. Trachea midline. Degenerative changes of the spine. IMPRESSION: 1. Bibasilar atelectasis versus scarring. 2. Suspect COPD/emphysema. Electronically Signed   By: Judie Petit.  Shick M.D.   On: 04/18/2023 12:34    Procedures Procedures    Medications Ordered in ED Medications  iohexol (OMNIPAQUE) 350 MG/ML injection 75 mL (75 mLs Intravenous Contrast Given 04/18/23 1336)    ED Course/ Medical Decision Making/ A&P                                 Medical Decision Making Amount and/or Complexity of  Data Reviewed Labs: ordered. Radiology: ordered.  Risk Prescription drug management.   Patient with mental status change.  Reportedly difficulty speaking.  Confusion.  Reportedly unsteady.  Is on anticoagulation for A-fib.  Mental status improving.  With the aphasia stroke aspirin considered.  Head CT reassuring.  Not a TNK candidate due to improving deficits.  Also unknown last normal.  Workup does show elevated creatinine.  A month ago was normal now 1.5.  Denies dysuria.  States he has to urinate now.  I think patient will benefit from mission to the hospital.  Will discuss with unassigned medicine.        Final Clinical Impression(s) / ED Diagnoses Final diagnoses:  Aphasia  Altered mental status, unspecified altered mental status type  AKI (acute kidney injury) [facility])    Rx / DC Orders ED Discharge Orders     None         Benjiman Core, MD 04/18/23 1456

## 2023-04-18 NOTE — H&P (Signed)
Date: 04/18/2023               Patient Name:  Martin Golden MRN: [id number]  DOB: 01/17/1951 Age / Sex: 72 y.o., male   PCP: Gracelyn Nurse, MD         Medical Service: Internal Medicine Teaching Service         Attending Physician: Dr. Ginnie Smart, MD    First Contact: Dr. Laretta Bolster Pager: [phone]  Second Contact: Dr. Jason Fila Nooruddin Pager: [phone]       After Hours (After 5p/  First Contact Pager: [phone]  weekends / holidays): Second Contact Pager: [phone]   Chief Complaint: Speech making sense.  History of Present Illness:  Patient brought in with mental status change. Patient brought in by [facility] EMS for stroke like symptoms. Patient was on the phone with credit union, with incompressible speech. Credit union staff called EMS. Patient was nonverbal upon arrival of EMS. Patient had difficulty following commands, with repetitive speech. Patient has difficulty with balance. Patient is anticoagulated, hx of TIA's.   Patient is now alert and oriented x4. No dysarthria at this time, some expressive aphasia noted. Patient is noted to have bruising to face, family states this happened weeks ago. EMS vitals: 136/84 BP, 70 HR, 98 O2% RA, 16 RR. CBG 146.18 R AC   Patient is able to answer questions, but seems slightly agitated when he cannot answer with intermittent confusion.  He denies facial drooping, arm or leg weakness.  He also denies chest pain, cough or leg swelling.  ED Course.  POC glucose was normal.  Head CT reassuring. Not a TNK candidate due to improving deficits. Also unknown last normal.  Creatinine elevated at 1.5. Baseline around 0.9-1.0. Denies dysuria.  Chest x-ray showed evidence of of bibasilar atelectasis versus scarring.  Background COPD emphysema suspected.  CT angio of the head and neck no acute findings.No Stenosis.  Meds:  Current Facility-Administered Medications for the 04/18/23 encounter [facility] Encounter)  Medication    bupivacaine (PF) (MARCAINE) 0.25 % injection 30 mL   fentaNYL (SUBLIMAZE) injection 100 mcg   lactated ringers infusion 1,000 mL   lactated ringers infusion 1,000 mL   lactated ringers infusion 1,000 mL   lidocaine (PF) (XYLOCAINE) 1 % injection 10 mL   midazolam (VERSED) 5 MG/5ML injection 5 mg   orphenadrine (NORFLEX) injection 60 mg   triamcinolone acetonide (KENALOG-40) injection 40 mg   Current Meds  Medication Sig   DULoxetine (CYMBALTA) 60 MG capsule Take 60 mg by mouth daily.   ELIQUIS 5 MG TABS tablet Take 5 mg by mouth in the morning and at bedtime.   gabapentin (NEURONTIN) 100 MG capsule TAKE 1 CAPSULE BY MOUTH THREE TIMES A DAY   HYDROcodone-acetaminophen (NORCO) 10-325 MG tablet Limit one tablet by mouth 2-3 times per day if tolerated (Patient taking differently: Take 1-2 tablets by mouth See admin instructions. Limit one tablet by mouth 2-3 times per day if tolerated)   lisinopril (PRINIVIL,ZESTRIL) 20 MG tablet Take 20 mg by mouth daily.   zolpidem (AMBIEN) 10 MG tablet Take 10 mg by mouth at bedtime as needed for sleep.     Allergies: Allergies as of 04/18/2023   (No Known Allergies)   Past Medical History:  Diagnosis Date   Bronchitis    Prostate cancer (HCC) 2014   had radiation treatments   Sinusitis      Social History:  Former smoker, quit 31 years ago.  His smoking  use included cigarettes and pipe. He has a 14.00 pack-year smoking history. He uses smokeless tobacco. He reports current alcohol use   Lives by himself, able to care for his ADLs.  Review of Systems: A complete ROS was negative except as per HPI.   Physical Exam: Blood pressure 102/80, pulse 74, temperature 98.3 F (36.8 C), temperature source Oral, resp. rate 15, height 5\' 11"  (1.803 m), weight 105.2 kg, SpO2 99%.  General: Well-appearing, not in acute distress.   CV: RRR. No murmurs, rubs, or gallops. No LE edema Pulmonary: Lungs CTAB. Normal effort. No wheezing or rales. Abdominal:  Soft, nontender, nondistended. Normal bowel sounds. Extremities: Palpable pulses. Normal ROM. Skin: Warm and dry. No obvious rash or lesions. Neuro:   Mental Status: He is alert.  Comments: Awake and appropriate.  Answers questions.  Moves all extremities.  Equal breath strength bilaterally.  Does have some memory issues.  Some word finding.    EKG: personally reviewed my interpretation is normal sinus rhythm, ST elevation in leads II, and lead III.  CXR: personally reviewed my interpretation is basilar atelectasis and COPD/emphysema in the background.  Assessment & Plan by Problem: Principal Problem:   Transient ischemic attack Active Problems:   AKI (acute kidney injury) (HCC)   History of bladder cancer   Paroxysmal A-fib (HCC)  Transient ischemic attack(TIA) On presentation, he had difficulty following commands, with repetitive speech.  He also had issues with balance, all of which has resolved. He has history of TIAs, currently on anticoagulation.  On neuroexam, no focal deficits was appreciated. He had CT head and neck, which was negative for stenosis.  He also had an EKG that showed a normal sinus rhythm with mild ST elevations in lead II and III.  Coagulation panel was also normal.  Toxicology screen was also negative.  Patient failed his bedside swallow test.  -N.p.o. -Will check lipids, hemoglobin A1c -MRI of the head -Continue aspirin  and Eliquis  Acute kidney injury -Creatinine elevated to 1.5.  Baseline creatinine around 0.9-1.0.  Differential diagnosis includes a prerenal vs. intrarenal vs. postrenal.  He is not complaining of urinary retention, so low suspicion for postrenal. High suspicion for prerenal cause.  He is status post 1 L of normal saline. -Monitor BMP. -Holding nephrotoxic drugs.  Proximal A-fib History of A-fib diagnosed in 03/2021. He is on Eliquis for anticoagulation.   Currently rate controlled on metoprolol 25 mg XL. -Holding metoprolol 25  mg.  History of bladder cancer He has a history of exophytic bladder tumor in left posterior lateral wall. He had surgery 10/24/20 with Dr. Gean Maidens, at [facility]  -Monitor.  Chronic medical problems  Panic attacks -Continue alprazolam  Hypertension Blood pressure stable at 113/55 -Holding lisinopril due to acute kidney injury.  Dispo: Admit patient to Inpatient with expected length of stay greater than 2 midnights.  Signed:  Laretta Bolster, M.D.  Internal Medicine Resident, PGY-1 [facility]  Pager: [phone] 6:08 PM, 04/18/2023   **Please contact the on call pager after 5 pm and on weekends at [phone].**

## 2023-04-18 NOTE — Progress Notes (Signed)
Pt back on unit from MRI.

## 2023-04-18 NOTE — ED Triage Notes (Signed)
Patient brought in by [facility] EMS for stroke like symptoms. Patient was on the phone with credit union, with incompressible speech. Credit union staff called EMS. Patient was nonverbal upon arrival of EMS. Patient had difficulty following commands, with repetitive speech. Patient has difficulty with balance. Patient is anticoagulated, hx of TIA's. Patient is now alert and oriented x4. No dysarthria at this time, some expressive aphasia noted. Patient is noted to have bruising to face, family states this happened weeks ago. EMS vitals: 136/84 BP, 70 HR, 98 O2% RA, 16 RR. CBG 146.18 R AC

## 2023-04-19 ENCOUNTER — Inpatient Hospital Stay (HOSPITAL_COMMUNITY): Payer: Medicare Other

## 2023-04-19 DIAGNOSIS — R4182 Altered mental status, unspecified: Secondary | ICD-10-CM

## 2023-04-19 DIAGNOSIS — R569 Unspecified convulsions: Secondary | ICD-10-CM

## 2023-04-19 DIAGNOSIS — G459 Transient cerebral ischemic attack, unspecified: Secondary | ICD-10-CM | POA: Diagnosis not present

## 2023-04-19 LAB — BASIC METABOLIC PANEL
Anion gap: 10 (ref 5–15)
BUN: 18 mg/dL (ref 8–23)
CO2: 21 mmol/L — ABNORMAL LOW (ref 22–32)
Calcium: 8.6 mg/dL — ABNORMAL LOW (ref 8.9–10.3)
Chloride: 102 mmol/L (ref 98–111)
Creatinine, Ser: 1.2 mg/dL (ref 0.61–1.24)
GFR, Estimated: >60 mL/min (ref >60)
Glucose, Bld: 90 mg/dL (ref 70–99)
Potassium: 3.7 mmol/L (ref 3.5–5.1)
Sodium: 133 mmol/L — ABNORMAL LOW (ref 135–145)

## 2023-04-19 MED ORDER — ALPRAZOLAM 0.5 MG PO TABS
0.5000 mg | ORAL_TABLET | Freq: Every day | ORAL | Status: DC | PRN
  Administered 2023-04-19 – 2023-04-20 (×2): 0.5 mg via ORAL
  Filled 2023-04-19 (×2): qty 1

## 2023-04-19 MED ORDER — APIXABAN 5 MG PO TABS
5.0000 mg | ORAL_TABLET | Freq: Two times a day (BID) | ORAL | Status: DC
  Administered 2023-04-19 – 2023-04-20 (×3): 5 mg via ORAL
  Filled 2023-04-19 (×3): qty 1

## 2023-04-19 MED ORDER — HYDROCODONE-ACETAMINOPHEN 10-325 MG PO TABS: 1.0000 | ORAL_TABLET | Freq: Four times a day (QID) | ORAL | Status: DC | PRN

## 2023-04-19 MED ORDER — ROSUVASTATIN CALCIUM 5 MG PO TABS
5.0000 mg | ORAL_TABLET | Freq: Every day | ORAL | Status: DC
  Administered 2023-04-20: 5 mg via ORAL
  Filled 2023-04-19: qty 1

## 2023-04-19 MED ORDER — METOPROLOL SUCCINATE ER 25 MG PO TB24
25.0000 mg | ORAL_TABLET | Freq: Every day | ORAL | Status: DC
  Administered 2023-04-19 – 2023-04-20 (×2): 25 mg via ORAL
  Filled 2023-04-19 (×2): qty 1

## 2023-04-19 NOTE — TOC Initial Note (Signed)
Transition of Care [facility]) - Initial/Assessment Note    Patient Details  Name: Martin Golden MRN: [id number] Date of Birth: May 23, 1951  Transition of [facility]) CM/SW Contact:    Kermit Balo, RN Phone Number: 04/19/2023, 2:39 PM  Clinical Narrative:                 Pt is from home alone. Pt was driving self and managing his own medications prior to admission. Pt gave permission for CM to speak to his son. Son is unsure of support they can provide pt after d/c. He will discuss with his mother and CM will follow up tomorrow.  No f/u per PT but OT/ST feel he needs supervision after d/c.  TOC following.  Expected Discharge Plan: Home w Home Health Services Barriers to Discharge: Continued Medical Work up   Patient Goals and CMS Choice   CMS Medicare.gov Compare Post Acute Care list provided to:: Patient Represenative (must comment) Choice offered to / list presented to : Adult Children      Expected Discharge Plan and Services       Living arrangements for the past 2 months: Apartment                                      Prior Living Arrangements/Services Living arrangements for the past 2 months: Apartment Lives with:: Self Patient language and need for interpreter reviewed:: Yes Do you feel safe going back to the place where you live?: Yes          Current home services: DME (cane) Criminal Activity/Legal Involvement Pertinent to Current Situation/Hospitalization: No - Comment as needed  Activities of Daily Living Home Assistive Devices/Equipment: Cane (specify quad or straight) ADL Screening (condition at time of admission) Patient's cognitive ability adequate to safely complete daily activities?: Yes Is the patient deaf or have difficulty hearing?: No Does the patient have difficulty seeing, even when wearing glasses/contacts?: No Does the patient have difficulty concentrating, remembering, or making decisions?: No Patient able to express need for  assistance with ADLs?: No Does the patient have difficulty dressing or bathing?: No Independently performs ADLs?: Yes (appropriate for developmental age) Does the patient have difficulty walking or climbing stairs?: Yes Weakness of Legs: Both Weakness of Arms/Hands: None  Permission Sought/Granted                  Emotional Assessment Appearance:: Appears stated age Attitude/Demeanor/Rapport: Engaged Affect (typically observed): Accepting Orientation: : Oriented to Self, Oriented to Place   Psych Involvement: No (comment)  Admission diagnosis:  Aphasia [R47.01] Transient ischemic attack [G45.9] AKI (acute kidney injury) (HCC) [N17.9] Altered mental status, unspecified altered mental status type [R41.82] Patient Active Problem List   Diagnosis Date Noted   Transient ischemic attack 04/18/2023   AKI (acute kidney injury) (HCC) 04/18/2023   History of bladder cancer 04/18/2023   Paroxysmal A-fib (HCC) 04/18/2023   Status post hip surgery 05/14/2015   DDD (degenerative disc disease), lumbar 02/20/2015   Facet syndrome, lumbar 02/20/2015   Lumbar radiculopathy 02/20/2015   DDD (degenerative disc disease), cervical 02/20/2015   Cervical facet syndrome 02/20/2015   Sacroiliac joint dysfunction 02/20/2015   Spinal stenosis of lumbar region 02/20/2015   PCP:  Gracelyn Nurse, MD Pharmacy:   [facility] [id number] - [place], [place] - [address] [place] [address] Phone: [phone]  Fax: [phone]     Social Determinants of Health (SDOH) Social History: SDOH Screenings   Food Insecurity: No Food Insecurity (04/18/2023)  Housing: Low Risk  (04/18/2023)  Transportation Needs: No Transportation Needs (04/18/2023)  Utilities: Not At Risk (04/18/2023)  Tobacco Use: Medium Risk (04/18/2023)   SDOH Interventions:     Readmission Risk Interventions     No data to display

## 2023-04-19 NOTE — Evaluation (Addendum)
Clinical/Bedside Swallow Evaluation Patient Details  Name: Martin Golden MRN: [id number] Date of Birth: 12-22-1950  Today's Date: 04/19/2023 Time: SLP Start Time (ACUTE ONLY): 0812 SLP Stop Time (ACUTE ONLY): 0826 SLP Time Calculation (min) (ACUTE ONLY): 14 min  Past Medical History:  Past Medical History:  Diagnosis Date   Bronchitis    Prostate cancer (HCC) 2014   had radiation treatments   Sinusitis    Past Surgical History:  Past Surgical History:  Procedure Laterality Date   HIP SURGERY Right 2011   HPI:  72 yr old admitted with icompressible speech. Credit union staff called EMS. Patient was nonverbal upon arrival of EMS. No dysarthria at this time, some expressive aphasia noted. Patient is noted to have bruising to face, family states this happened weeks ago. MRI Moderately motion degraded study. Within this limitation, no acute intracranial process. Possible TIA's per documentation in chart. PMH: bronchitis, prostate cancer.    Assessment / Plan / Recommendation  Clinical Impression  Pt showing signs of potential pharyngeal dysphagia with initial immediate cough with thin liquids, lessened when straw removed. His oromotor is intact as is dentition. He was observed with breakfast and needed cues for smaller bites. As session progressed he did not cough with liquid but had intermittent delayed throat clear. He denies history of GERD. He was able to clear oral cavity. Pt cued to take small sips and bites, sit upright. Recommend he continue regular texture, thin liquids, pills whole in puree and intermittent assist for strategies/feeding. ST will continue briefly for safety and efficiency with recommendations. SLP Visit Diagnosis: Dysphagia, unspecified (R13.10)    Aspiration Risk  Mild aspiration risk    Diet Recommendation Regular;Thin liquid    Liquid Administration via: Straw;Cup Medication Administration: Whole meds with puree Supervision: Patient able to self  feed;Intermittent supervision to cue for compensatory strategies Compensations: Slow rate;Small sips/bites Postural Changes: Seated upright at 90 degrees    Other  Recommendations Oral Care Recommendations: Oral care BID    Recommendations for follow up therapy are one component of a multi-disciplinary discharge planning process, led by the attending physician.  Recommendations may be updated based on patient status, additional functional criteria and insurance authorization.  Follow up Recommendations        Assistance Recommended at Discharge    Functional Status Assessment Patient has had a recent decline in their functional status and demonstrates the ability to make significant improvements in function in a reasonable and predictable amount of time.  Frequency and Duration min 2x/week  2 weeks       Prognosis Prognosis for improved oropharyngeal function: Good Barriers to Reach Goals: Cognitive deficits      Swallow Study   General HPI: 72 yr old admitted with icompressible speech. Credit union staff called EMS. Patient was nonverbal upon arrival of EMS. No dysarthria at this time, some expressive aphasia noted. Patient is noted to have bruising to face, family states this happened weeks ago. MRI Moderately motion degraded study. Within this limitation, no acute intracranial process. Type of Study: Bedside Swallow Evaluation Previous Swallow Assessment: none Diet Prior to this Study: Regular;Thin liquids (Level 0) Temperature Spikes Noted: No Respiratory Status: Room air History of Recent Intubation: No Behavior/Cognition: Alert;Pleasant mood;Cooperative Oral Cavity Assessment: Within Functional Limits Oral Care Completed by SLP: No Oral Cavity - Dentition: Adequate natural dentition Vision: Functional for self-feeding Self-Feeding Abilities: Able to feed self Patient Positioning: Upright in bed Baseline Vocal Quality: Normal Volitional Cough: Strong Volitional Swallow:  Able to elicit  Oral/Motor/Sensory Function Overall Oral Motor/Sensory Function: Within functional limits   Ice Chips Ice chips: Not tested   Thin Liquid Thin Liquid: Impaired Presentation: Self Fed;Cup;Straw Pharyngeal  Phase Impairments: Cough - Immediate;Throat Clearing - Delayed    Nectar Thick Nectar Thick Liquid: Not tested   Honey Thick Honey Thick Liquid: Not tested   Puree Puree: Not tested   Solid     Solid: Impaired Presentation: Self Fed Pharyngeal Phase Impairments: Throat Clearing - Delayed      Royce Macadamia 04/19/2023,8:43 AM

## 2023-04-19 NOTE — Progress Notes (Signed)
EEG complete - results pending 

## 2023-04-19 NOTE — Evaluation (Addendum)
Physical Therapy Evaluation Patient Details Name: Martin Golden MRN: [id number] DOB: 02/02/1951 Today's Date: 04/19/2023  History of Present Illness  Pt is a 72 y/o male presenting with impaired speech when talking on phone with credit union staff. MRI brain negative, suspected TIA and AKI. PMH: TIAs, prostate cancer, a fib, HTN   Clinical Impression  Patient irritated upon arrival to room. Gown half off, swearing about I'm not sure what. Assisted with gown and patient is independent with bed mobility. Declined ambulating. Then with encouragement he agrees to walk around to recliner ~ 10 feet no AD. Patient appears to do well with mobility but decreased insight. Asking where he is at one point. He will benefit from 24/hr supervision upon discharge to ensure safety.           If plan is discharge home, recommend the following: Supervision due to cognitive status   Can travel by private vehicle    yes    Equipment Recommendations None recommended by PT  Recommendations for Other Services       Functional Status Assessment Patient has not had a recent decline in their functional status     Precautions / Restrictions Precautions Precautions: Fall Restrictions Weight Bearing Restrictions: No      Mobility  Bed Mobility Overal bed mobility: Needs Assistance Bed Mobility: Supine to Sit, Sit to Supine     Supine to sit: Supervision Sit to supine: Supervision        Transfers Overall transfer level: Needs assistance Equipment used: None Transfers: Sit to/from Stand Sit to Stand: Supervision                Ambulation/Gait Ambulation/Gait assistance: Supervision Gait Distance (Feet): 10 Feet Assistive device: None Gait Pattern/deviations: Step-through pattern Gait velocity: WNL     General Gait Details: patient not interested in ambulating outside of room. Agrees to walk around bed to recliner. No significant difficulty with this. Impulsive.  Stairs             Wheelchair Mobility     Tilt Bed    Modified Rankin (Stroke Patients Only)       Balance Overall balance assessment: Needs assistance Sitting-balance support: Feet supported Sitting balance-Leahy Scale: Normal     Standing balance support: No upper extremity supported, During functional activity Standing balance-Leahy Scale: Good                               Pertinent Vitals/Pain Pain Assessment Pain Assessment: No/denies pain    Home Living Family/patient expects to be discharged to:: Private residence Living Arrangements: Alone   Type of Home: House Home Access: Stairs to enter       Home Layout: One level Home Equipment: Gilmer Mor - single point Additional Comments: unable to report how many steps he has to enter home w/ expressive deficits; hard to determine true home setup    Prior Function Prior Level of Function : Independent/Modified Independent;Driving             Mobility Comments: reports using cane, a fall on June 16th with continued R shoulder pain (reports Xray negative) ADLs Comments: Indep with ADLs, IADLs, was on the phone with the bank prior to this episode     Extremity/Trunk Assessment   Upper Extremity Assessment Upper Extremity Assessment: Overall WFL for tasks assessed    Lower Extremity Assessment Lower Extremity Assessment: Overall WFL for tasks assessed    Cervical / Trunk  Assessment Cervical / Trunk Assessment: Normal  Communication   Communication Communication: Difficulty communicating thoughts/reduced clarity of speech;Difficulty following commands/understanding Following commands: Follows one step commands inconsistently;Follows one step commands with increased time Cueing Techniques: Verbal cues  Cognition Arousal: Alert Behavior During Therapy: Agitated, Restless Overall Cognitive Status: No family/caregiver present to determine baseline cognitive functioning                                  General Comments: pt reports Sept 2024, some speech [facility] and other statements unintelligible. follows one step commands fairly consistently but impulsive and often attempting to lay back down in bed during attempted assessments. difficult to fully determine cognitive abilities for med mgmt, etc at this time. requires redirection. only able to name "cat" when asked about 3 animals that start with "C". Attempted to prompt with OT saying "cougar" with pt then saying "lion" - reminded pt that didnt start with C and pt laughed        General Comments      Exercises     Assessment/Plan    PT Assessment Patient needs continued PT services  PT Problem List Decreased activity tolerance;Decreased mobility;Decreased balance;Decreased safety awareness       PT Treatment Interventions Gait training;Stair training;Functional mobility training;Therapeutic activities;Therapeutic exercise;Patient/family education    PT Goals (Current goals can be found in the Care Plan section)  Acute Rehab PT Goals Patient Stated Goal: none stated PT Goal Formulation: With patient Time For Goal Achievement: 04/26/23    Frequency Min 1X/week     Co-evaluation               AM-PAC PT "6 Clicks" Mobility  Outcome Measure Help needed turning from your back to your side while in a flat bed without using bedrails?: None Help needed moving from lying on your back to sitting on the side of a flat bed without using bedrails?: None Help needed moving to and from a bed to a chair (including a wheelchair)?: A Little Help needed standing up from a chair using your arms (e.g., wheelchair or bedside chair)?: A Little Help needed to walk in hospital room?: A Little Help needed climbing 3-5 steps with a railing? : A Little 6 Click Score: 20    End of Session   Activity Tolerance: Treatment limited secondary to agitation Patient left: in chair;with call bell/phone within reach;with chair alarm set Nurse  Communication: Mobility status PT Visit Diagnosis: Unsteadiness on feet (R26.81);Other abnormalities of gait and mobility (R26.89);Difficulty in walking, not elsewhere classified (R26.2);Other symptoms and signs involving the nervous system (R29.898)    Time: 0981-1914 PT Time Calculation (min) (ACUTE ONLY): 9 min   Charges:   PT Evaluation $PT Eval Moderate Complexity: 1 Mod   PT General Charges $$ ACUTE PT VISIT: 1 Visit          , PT, GCS 04/19/23,11:20 AM

## 2023-04-19 NOTE — Evaluation (Signed)
Speech Language Pathology Evaluation Patient Details Name: Martin Golden MRN: [id number] DOB: 06-08-1951 Today's Date: 04/19/2023 Time: 9323-5573 SLP Time Calculation (min) (ACUTE ONLY): 17 min  Problem List:  Patient Active Problem List   Diagnosis Date Noted   Transient ischemic attack 04/18/2023   AKI (acute kidney injury) (HCC) 04/18/2023   History of bladder cancer 04/18/2023   Paroxysmal A-fib (HCC) 04/18/2023   Status post hip surgery 05/14/2015   DDD (degenerative disc disease), lumbar 02/20/2015   Facet syndrome, lumbar 02/20/2015   Lumbar radiculopathy 02/20/2015   DDD (degenerative disc disease), cervical 02/20/2015   Cervical facet syndrome 02/20/2015   Sacroiliac joint dysfunction 02/20/2015   Spinal stenosis of lumbar region 02/20/2015   Past Medical History:  Past Medical History:  Diagnosis Date   Bronchitis    Prostate cancer (HCC) 2014   had radiation treatments   Sinusitis    Past Surgical History:  Past Surgical History:  Procedure Laterality Date   HIP SURGERY Right 2011   HPI:  72 yr old admitted with icompressible speech. [employer] staff called EMS. Patient was nonverbal upon arrival of EMS. No dysarthria at this time, some expressive aphasia noted. Patient is noted to have bruising to face, family states this happened weeks ago. MRI Moderately motion degraded study. Within this limitation, no acute intracranial process. Possible TIA's per documentation in chart. PMH: bronchitis, prostate cancer.   Assessment / Plan / Recommendation Clinical Impression  Pt reports living alone and being completely independent at baseline. Per RN, pt able to better communicate earlier in admission and presentation today appears to be a new onset change in function. Pt alert and oriented x4, able to participate in simple conversation to provide history. Portions of the SLUMS were administered, although it was not completed due to pt's language deficits. His performance  is characterized by noted difficulties with sustained attention and memory. During the divergent naming portion, pt named four animals, then became perseverative on the word "bear" and listed five phonemic paraphasic words rhyming with "bear".  SLP then administered portions of the Bedside Western Aphasia Battery with noted deficits related to auditory comprehension, confrontational naming, and repetition. Pt with 0% accuracy with simple yes/no questions and following simple commands. Effectively able to repeat at the word and phrase level, but unsuccessful as complexity increased to the sentence level. Pt ~50% accurate during a confrontational naming activity with frequent phonemic paraphasias throughout. Overall, pt appears to have difficulty both expressively and receptively. Recommend ongoing cognitive linguistic SLP f/u.    SLP Assessment  SLP Recommendation/Assessment: Patient needs continued Speech Lanaguage Pathology Services SLP Visit Diagnosis: Aphasia (R47.01);Cognitive communication deficit (R41.841)    Recommendations for follow up therapy are one component of a multi-disciplinary discharge planning process, led by the attending physician.  Recommendations may be updated based on patient status, additional functional criteria and insurance authorization.    Follow Up Recommendations  Acute inpatient rehab (3hours/day)    Assistance Recommended at Discharge  Frequent or constant Supervision/Assistance  Functional Status Assessment Patient has had a recent decline in their functional status and demonstrates the ability to make significant improvements in function in a reasonable and predictable amount of time.  Frequency and Duration min 2x/week  2 weeks      SLP Evaluation Cognition  Overall Cognitive Status: Impaired/Different from baseline Arousal/Alertness: Awake/alert Orientation Level: Oriented X4 Attention: Sustained Sustained Attention: Impaired Sustained Attention  Impairment: Verbal basic Memory: Impaired Memory Impairment: Storage deficit;Retrieval deficit Awareness: Impaired Awareness Impairment: Intellectual impairment Problem  Solving: Impaired Problem Solving Impairment: Verbal basic       Comprehension  Auditory Comprehension Overall Auditory Comprehension: Impaired Yes/No Questions: Impaired Basic Biographical Questions: 0-25% accurate Complex Questions: 50-74% accurate Commands: Impaired One Step Basic Commands: 50-74% accurate Two Step Basic Commands: 50-74% accurate Conversation: Simple    Expression Expression Primary Mode of Expression: Verbal Verbal Expression Overall Verbal Expression: Impaired Initiation: No impairment Repetition: Impaired Level of Impairment: Sentence level Naming: Impairment Confrontation: Impaired Divergent: 25-49% accurate   Oral / Motor  Oral Motor/Sensory Function Overall Oral Motor/Sensory Function: Within functional limits Motor Speech Overall Motor Speech: Appears within functional limits for tasks assessed            Gwynneth Aliment, M.A., CF-SLP Speech Language Pathology, Acute Rehabilitation Services  Secure Chat preferred [phone]  04/19/2023, 9:50 AM

## 2023-04-19 NOTE — Evaluation (Addendum)
Occupational Therapy Evaluation Patient Details Name: Martin Golden MRN: [id number] DOB: Jan 28, 1951 Today's Date: 04/19/2023   History of Present Illness Pt is a 72 y/o male presenting with impaired speech when talking on phone with [employer] staff. MRI brain negative, suspected TIA and AKI. PMH: TIAs, prostate cancer, a fib, HTN   Clinical Impression   PTA, pt lives alone, typically Modified Independent with all ADLs, IADLs and mobility with use of cane. Pt presents now with deficits in speech, cognition and dynamic standing balance. Pt restless, approx 60% of statements appropriate though unable to clearly state home setup, name 3 animals that start with "C", etc. Pt initially required Min A for balance in standing but improved to CGA with in-room mobility without AD. Pt requires CGA for LB ADLs completed in standing. Feel pt moving physically well and BUE 5/5 strength. However, if pt were to DC home, would recommend assist with med mgmt, transportation, and cooking. Unsure if pt has this needed assistance.  Plan to follow up tomorrow for further functional cognitive assessments.       If plan is discharge home, recommend the following: Assistance with cooking/housework;Direct supervision/assist for medications management;Direct supervision/assist for financial management;Assist for transportation;Supervision due to cognitive status    Functional Status Assessment  Patient has had a recent decline in their functional status and demonstrates the ability to make significant improvements in function in a reasonable and predictable amount of time.  Equipment Recommendations  None recommended by OT    Recommendations for Other Services       Precautions / Restrictions Precautions Precautions: Fall Restrictions Weight Bearing Restrictions: No      Mobility Bed Mobility Overal bed mobility: Modified Independent                  Transfers Overall transfer level: Needs  assistance Equipment used: None Transfers: Sit to/from Stand Sit to Stand: Min assist, Contact guard assist           General transfer comment: initial Min A to rise from bed with posterior bias. improved with second stand and CGA only      Balance Overall balance assessment: Needs assistance, History of Falls Sitting-balance support: Feet supported, No upper extremity supported Sitting balance-Leahy Scale: Good     Standing balance support: No upper extremity supported, During functional activity Standing balance-Leahy Scale: Fair                             ADL either performed or assessed with clinical judgement   ADL Overall ADL's : Needs assistance/impaired Eating/Feeding: Independent   Grooming: Contact guard assist;Standing   Upper Body Bathing: Supervision/ safety   Lower Body Bathing: Contact guard assist   Upper Body Dressing : Supervision/safety   Lower Body Dressing: Contact guard assist   Toilet Transfer: Contact guard assist;Ambulation   Toileting- Clothing Manipulation and Hygiene: Contact guard assist;Sit to/from stand;Sitting/lateral lean       Functional mobility during ADLs: Contact guard assist General ADL Comments: one initial LOB standing from bed, improved with activity and able to mobilize in room. self limiting and attempting to lay back in bed multiple times     Vision Baseline Vision/History: 1 Wears glasses Ability to See in Adequate Light: 0 Adequate Patient Visual Report: No change from baseline Vision Assessment?: No apparent visual deficits     Perception         Praxis  Pertinent Vitals/Pain Pain Assessment Pain Assessment: Faces Faces Pain Scale: Hurts little more Pain Location: R shoulder Pain Descriptors / Indicators: Grimacing, Guarding Pain Intervention(s): Monitored during session     Extremity/Trunk Assessment Upper Extremity Assessment Upper Extremity Assessment: Overall WFL for tasks  assessed (5/5)   Lower Extremity Assessment Lower Extremity Assessment: Defer to PT evaluation   Cervical / Trunk Assessment Cervical / Trunk Assessment: Normal   Communication Communication Communication: Difficulty communicating thoughts/reduced clarity of speech;Difficulty following commands/understanding Following commands: Follows one step commands with increased time Cueing Techniques: Verbal cues;Tactile cues;Gestural cues   Cognition Arousal: Alert Behavior During Therapy: Restless Overall Cognitive Status: Difficult to assess                                 General Comments: pt reports Sept 2024, some speech [facility] and other statements unintelligible. follows one step commands fairly consistently but impulsive and often attempting to lay back down in bed during attempted assessments. difficult to fully determine cognitive abilities for med mgmt, etc at this time. requires redirection. only able to name "cat" when asked about 3 animals that start with "C". Attempted to prompt with OT saying "cougar" with pt then saying "lion" - reminded pt that didnt start with C and pt laughed     General Comments       Exercises     Shoulder Instructions      Home Living Family/patient expects to be discharged to:: Private residence Living Arrangements: Alone Available Help at Discharge: Available PRN/intermittently;Friend(s) Type of Home: House Home Access: Stairs to enter     Home Layout: One level               Home Equipment: Gilmer Mor - single point   Additional Comments: unable to report how many steps he has to enter home w/ expressive deficits; hard to determine true home setup      Prior Functioning/Environment Prior Level of Function : Independent/Modified Independent;Driving             Mobility Comments: reports using cane, a fall on June 16th with continued R shoulder pain (reports Xray negative) ADLs Comments: Indep with ADLs, IADLs, 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        OT Problem List: Impaired balance (sitting and/or standing);Decreased cognition      OT Treatment/Interventions: Self-care/ADL training;Therapeutic exercise;DME and/or AE instruction;Therapeutic activities;Cognitive remediation/compensation    OT Goals(Current goals can be found in the care plan section) Acute Rehab OT Goals Patient Stated Goal: lay back down, decrease R shoulder pain OT Goal Formulation: With patient Time For Goal Achievement: 05/03/23 Potential to Achieve Goals: Good  OT Frequency: Min 1X/week    Co-evaluation              AM-PAC OT "6 Clicks" Daily Activity     Outcome Measure Help from another person eating meals?: None Help from another person taking care of personal grooming?: A Little Help from another person toileting, which includes using toliet, bedpan, or urinal?: A Little Help from another person bathing (including washing, rinsing, drying)?: A Little Help from another person to put on and taking off regular upper body clothing?: A Little Help from another person to put on and taking off regular lower body clothing?: A Little 6 Click Score: 19   End of Session Nurse Communication: Mobility status  Activity Tolerance: Patient tolerated treatment well Patient  left: in bed;with call bell/phone within reach;with bed alarm set  OT Visit Diagnosis: Unsteadiness on feet (R26.81);Cognitive communication deficit (R41.841);Other symptoms and signs involving cognitive function                Time: 0729-0750 OT Time Calculation (min): 21 min Charges:  OT General Charges $OT Visit: 1 Visit OT Evaluation $OT Eval Moderate Complexity: 1 Mod  Bradd Canary, OTR/L Acute Rehab Services Office: [phone]   Lorre Munroe 04/19/2023, 8:22 AM

## 2023-04-19 NOTE — Procedures (Addendum)
Patient Name: Martin Golden  MRN: [id number]  Epilepsy Attending: Charlsie Quest  Referring Physician/Provider: Olegario Messier, MD  Date: 04/19/2023 Duration: 19 mins  Patient history: 72 yo M ith transient difficulty following commands with repetitive speech. EEG to evaluate for seizure  Level of alertness: Awake  AEDs during EEG study: GBP  Technical aspects: This EEG study was done with scalp electrodes positioned according to the 10-20 International system of electrode placement. Electrical activity was reviewed with band pass filter of 1-70Hz , sensitivity of 7 uV/mm, display speed of 87mm/sec with a 60Hz  notched filter applied as appropriate. EEG data were recorded continuously and digitally stored.  Video monitoring was available and reviewed as appropriate.  Description: The posterior dominant rhythm consists of 8 Hz activity of moderate voltage (25-35 uV) seen predominantly in posterior head regions, symmetric and reactive to eye opening and eye closing. Physiologic photic driving was not seen during photic stimulation.  Hyperventilation was not performed.      Of note, study had to be stopped prematurely due to patient tolerability.     IMPRESSION: This study is within normal limits. No seizures or epileptiform discharges were seen throughout the recording.  A normal interictal EEG does not exclud the diagnosis of epilepsy.   Annabelle Harman

## 2023-04-19 NOTE — Progress Notes (Addendum)
HD#1 Subjective:  Overnight Events: No acute event overnight.  He had MRI that was normal.  Patient was seen at the bedside.  Patient is able to follow commands.   Objective:  Vital signs in last 24 hours: Vitals:   04/18/23 1934 04/18/23 2306 04/19/23 0307 04/19/23 0731  BP: 107/60 120/71 (!) 110/55 119/71  Pulse: 72 (!) 40 69 69  Resp: 18 18 18 14   Temp: 98.3 F (36.8 C) (!) 97.4 F (36.3 C) 98.2 F (36.8 C) 98 F (36.7 C)  TempSrc: Oral Oral Oral Oral  SpO2: 96% 92% 96% 99%  Weight:      Height:       Supplemental O2: Room Air SpO2: 99 %  Physical Exam:    CV: RRR. No m/r/g. No LE edema Pulmonary: Lungs clear bilaterally.   Neuro: -Mental Status: Alert, able to follow commands. -CN 2-12 intact. -Strength 5/5 in bilateral upper and lower extremity.   -Normal sensation bilateral upper and lower extremities.   Filed Weights   04/18/23 1215  Weight: 105.2 kg    No intake or output data in the 24 hours ending 04/19/23 1033 Net IO Since Admission: No IO data has been entered for this period [04/19/23 1033]  Recent Labs    04/18/23 1211  GLUCAP 91     Pertinent Labs:    Latest Ref Rng & Units 04/18/2023   12:57 PM 04/18/2023   12:14 PM  CBC  WBC 4.0 - 10.5 K/uL  10.0   Hemoglobin 13.0 - 17.0 g/dL 16.1  09.6   Hematocrit 39.0 - 52.0 % 46.0  47.1   Platelets 150 - 400 K/uL  204        Latest Ref Rng & Units 04/19/2023    8:08 AM 04/18/2023   12:57 PM 04/18/2023   12:14 PM  CMP  Glucose 70 - 99 mg/dL 90  95  97   BUN 8 - 23 mg/dL 18  27  25    Creatinine 0.61 - 1.24 mg/dL 0.45  4.09  8.11   Sodium 135 - 145 mmol/L 133  135  133   Potassium 3.5 - 5.1 mmol/L 3.7  4.2  4.1   Chloride 98 - 111 mmol/L 102  102  101   CO2 22 - 32 mmol/L 21   19   Calcium 8.9 - 10.3 mg/dL 8.6   8.9   Total Protein 6.5 - 8.1 g/dL   6.3   Total Bilirubin 0.3 - 1.2 mg/dL   0.7   Alkaline Phos 38 - 126 U/L   78   AST 15 - 41 U/L   49   ALT 0 - 44 U/L   46      Imaging: MR BRAIN WO CONTRAST  Result Date: 04/18/2023 CLINICAL DATA:  Transient ischemic attack. EXAM: MRI HEAD WITHOUT CONTRAST TECHNIQUE: Multiplanar, multiecho pulse sequences of the brain and surrounding structures were obtained without intravenous contrast. COMPARISON:  MRI brain 06/02/2022.  CTA head/neck 04/18/2023. FINDINGS: Brain: Moderately motion degraded study. Within this limitation, no acute infarct or hemorrhage. Stable mild chronic small-vessel disease. No mass or midline shift. No hydrocephalus or extra-axial collection. No abnormal susceptibility. Vascular: Normal flow voids. Skull and upper cervical spine: Normal marrow signal. Sinuses/Orbits: No acute findings. Other: None. IMPRESSION: Moderately motion degraded study. Within this limitation, no acute intracranial process. Electronically Signed   By: Orvan Falconer M.D.   On: 04/18/2023 21:40   CT ANGIO HEAD NECK W WO CM  Result Date: 04/18/2023 CLINICAL DATA:  Neuro deficit, acute, stroke suspected. EXAM: CT ANGIOGRAPHY HEAD AND NECK WITH AND WITHOUT CONTRAST TECHNIQUE: Multidetector CT imaging of the head and neck was performed using the standard protocol during bolus administration of intravenous contrast. Multiplanar CT image reconstructions and MIPs were obtained to evaluate the vascular anatomy. Carotid stenosis measurements (when applicable) are obtained utilizing NASCET criteria, using the distal internal carotid diameter as the denominator. RADIATION DOSE REDUCTION: This exam was performed according to the departmental dose-optimization program which includes automated exposure control, adjustment of the mA and/or kV according to patient size and/or use of iterative reconstruction technique. CONTRAST:  75mL OMNIPAQUE IOHEXOL 350 MG/ML SOLN COMPARISON:  Brain MRI 06/02/2022 FINDINGS: CT HEAD FINDINGS Brain: No visible acute infarction. The brainstem and cerebellum are normal. There are minimal small vessel changes of the  hemispheric white matter and old small cortical/subcortical insult at the left frontal vertex. No mass, hemorrhage, hydrocephalus or extra-axial collection. Vascular: No abnormal vascular finding. Skull: Normal Sinuses/Orbits: Clear/normal Other: None Review of the MIP images confirms the above findings CTA NECK FINDINGS Aortic arch: Normal aortic arch.  Normal branching pattern. Right carotid system: Common carotid artery widely patent to the bifurcation. Minimal soft plaque at the bifurcation but no stenosis. Cervical ICA is normal. Left carotid system: Common carotid artery widely patent to the bifurcation. Minimal soft plaque at the bifurcation but no stenosis. Cervical ICA is normal. Vertebral arteries: Both vertebral artery origins are widely patent. Both vertebral arteries appear normal through the cervical region to the foramen magnum. Skeleton: Ordinary spondylosis and facet arthropathy. Other neck: No mass or lymphadenopathy. Upper chest: Lung apices are clear. Review of the MIP images confirms the above findings CTA HEAD FINDINGS Anterior circulation: Both internal carotid arteries are patent through the skull base and siphon regions. No siphon stenosis. The anterior and middle cerebral vessels are patent. No large vessel occlusion or proximal stenosis. No aneurysm or vascular malformation. Posterior circulation: Both vertebral arteries widely patent through the foramen magnum to the basilar artery. No basilar stenosis. Posterior circulation branch vessels are normal. Venous sinuses: Patent and normal. Anatomic variants: None significant. Review of the MIP images confirms the above findings IMPRESSION: 1. No acute CT finding. Minimal small vessel changes of the hemispheric white matter and old small cortical/subcortical insult at the left frontal vertex. 2. Minimal soft plaque at both carotid bifurcations but no stenosis. 3. Normal intracranial CTA. Electronically Signed   By: Paulina Fusi M.D.   On:  04/18/2023 13:48   DG Chest Portable 1 View  Result Date: 04/18/2023 CLINICAL DATA:  Altered mental status EXAM: PORTABLE CHEST 1 VIEW COMPARISON:  06/03/2022 FINDINGS: Bibasilar atelectasis versus scarring. Background COPD/emphysema suspected. Stable heart size and vascularity. Negative for edema, definite pneumonia, large effusion or pneumothorax. Trachea midline. Degenerative changes of the spine. IMPRESSION: 1. Bibasilar atelectasis versus scarring. 2. Suspect COPD/emphysema. Electronically Signed   By: Judie Petit.  Shick M.D.   On: 04/18/2023 12:34    Assessment/Plan:   Principal Problem:   Transient ischemic attack Active Problems:   AKI (acute kidney injury) (HCC)   History of bladder cancer   Paroxysmal A-fib [facility])   Patient Summary: 72 yo M with hx of previous aphasic episode (pt unclear of date), parox afib (on eliquis), prostate ca (2014), bladder tumor resection (2022) who presented to the ED with difficulty talking.  So far workup unremarkable.  #Transient ischemic attack(TIA) Patient was presented with difficulty following commands with repetitive speech, all of which  has resolved. Imaging with brain MRI was normal.  He had a CT head and neck which was also negative for stenosis.  On exam this morning, no focal neurodeficit was appreciated.  On labs A1c normal, LDL slightly elevated(78). Unclear etiology of patient's TIA.  He passed bedside swallow test.  Will discontinue aspirin given imaging is not concerning for an acute stroke.  -EEG -PT/OT evaluation -SLP evaluation. -Will get echocardiogram. -Aspirin discontinued -Start Rosuvastatin 10 mg.  #Acute kidney injury Cr improved to 1.2. Baseline around 1-1.20  #Proximal A-fib He is on Eliquis 5 mg.  Rate controlled with metoprolol 25 mg XL.  History of bladder cancer Diagnosed in 2014, with bladder resection in 2022. PSA normal.  Chronic medical problems   Panic attacks -Continue alprazolam    Hypertension Stable. -Restarted lisinopril 10 mg.  Diet: Regular diet IVF: PO intake VTE: SCDs Start: 04/18/23 1606apixaban (ELIQUIS) tablet 5 mg  Code: Full PT/OT: Pending ID:  Anti-infectives (From admission, onward)    None        Anticipated discharge to PT/OT recommendation.  Laretta Bolster, M.D.  Internal Medicine Resident, PGY-1 [facility]  Pager: [phone] 11:14 AM, 04/19/2023   **Please contact the on call pager after 5 pm and on weekends at [phone].**

## 2023-04-19 NOTE — Plan of Care (Signed)
  Problem: Education: Goal: Knowledge of General Education information will improve Description Including pain rating scale, medication(s)/side effects and non-pharmacologic comfort measures Outcome: Progressing   Problem: Health Behavior/Discharge Planning: Goal: Ability to manage health-related needs will improve Outcome: Progressing   

## 2023-04-19 NOTE — Plan of Care (Signed)
  Problem: Education: Goal: Knowledge of General Education information will improve Description Including pain rating scale, medication(s)/side effects and non-pharmacologic comfort measures Outcome: Progressing   

## 2023-04-19 NOTE — Progress Notes (Signed)
*  PRELIMINARY RESULTS* Echocardiogram 2D Echocardiogram has been performed.   Martin Golden 04/19/2023, 5:35 PM

## 2023-04-20 ENCOUNTER — Other Ambulatory Visit (HOSPITAL_COMMUNITY): Payer: Self-pay

## 2023-04-20 DIAGNOSIS — E785 Hyperlipidemia, unspecified: Secondary | ICD-10-CM

## 2023-04-20 DIAGNOSIS — G459 Transient cerebral ischemic attack, unspecified: Secondary | ICD-10-CM | POA: Diagnosis not present

## 2023-04-20 DIAGNOSIS — N179 Acute kidney failure, unspecified: Secondary | ICD-10-CM | POA: Diagnosis not present

## 2023-04-20 MED ORDER — ROSUVASTATIN CALCIUM 5 MG PO TABS
5.0000 mg | ORAL_TABLET | Freq: Every day | ORAL | 0 refills | Status: DC
  Filled 2023-04-20: qty 30, 30d supply, fill #0

## 2023-04-20 NOTE — Discharge Summary (Signed)
Name: Martin Golden MRN: [id number] DOB: 08-26-1951 72 y.o. PCP: Gracelyn Nurse, MD  Date of Admission: 04/18/2023 12:05 PM Date of Discharge: 04/20/23 Attending Physician: Dr. Ninetta Lights, JEFFREY C   Discharge Diagnosis: Principal Problem:   Transient ischemic attack Active Problems:   AKI (acute kidney injury) (HCC)   History of bladder cancer   Paroxysmal A-fib [facility])    Discharge Medications: Allergies as of 04/20/2023   No Known Allergies      Medication List     STOP taking these medications    aspirin EC 81 MG tablet   furosemide 40 MG tablet Commonly known as: LASIX   phentermine 37.5 MG capsule   sildenafil 20 MG tablet Commonly known as: REVATIO       TAKE these medications    ALPRAZolam 1 MG tablet Commonly known as: XANAX Take 1 mg by mouth as needed for anxiety.   DULoxetine 60 MG capsule Commonly known as: CYMBALTA Take 60 mg by mouth daily.   Eliquis 5 MG Tabs tablet Generic drug: apixaban Take 5 mg by mouth in the morning and at bedtime.   gabapentin 100 MG capsule Commonly known as: NEURONTIN TAKE 1 CAPSULE BY MOUTH THREE TIMES A DAY   HYDROcodone-acetaminophen 10-325 MG tablet Commonly known as: NORCO Limit one tablet by mouth 2-3 times per day if tolerated What changed:  how much to take how to take this when to take this   ibuprofen 200 MG tablet Commonly known as: ADVIL Take 200 mg by mouth 3 (three) times daily. Takes 3 tablets  Two to three times daily   lisinopril 20 MG tablet Commonly known as: ZESTRIL Take 20 mg by mouth daily.   rosuvastatin 5 MG tablet Commonly known as: CRESTOR Take 1 tablet (5 mg total) by mouth daily. Start taking on: April 21, 2023   zolpidem 10 MG tablet Commonly known as: AMBIEN Take 10 mg by mouth at bedtime as needed for sleep.        Disposition and follow-up:   Mr.Hutton Kosky was discharged from [facility] in [place] condition.  At the hospital follow up  visit please address:  1.  Follow-up:  a.  TIA -On discharge, symptoms have resolved. - Neurology follow up for further assessment. [facility] [phone] He will need a referral from PCP   b.  Sleep evaluation.  -He reported about a week of sleep changes prior to presentation to the hospital.  Evaluate him for OSA.     Follow-up Appointments: -Neurology outpatient follow-up.   Hospital Course by problem list: Transient ischemic attack(TIA) Patient brought in with mental status change, by [facility] for stroke like symptoms. Patient was on the phone with credit union, with incompressible speech  At the ED, he had difficulty following commands, with repetitive speech, which all resolved in less than an hour. On neuroexam, he had no focal deficit.  He had an EKG that was normal.  Started on aspirin given concern for stroke. Imaging of the brain with MRI, CT/CTA were all normal.    Aspirin stopped after MRI and CTs were negative.He also had echo with bubble study which was also normal.  A1c normal, LDL slightly elevated.  Started on 10 mg of rosuvastatin. Throughout his hospitalization, there were times where he was confused, and mildly agitated.  Had an EEG that was also normal. Unclear the etiology of the TIA, however I suspect patient might also have some underlying dementia.  Patient would benefit from  an outpatient follow-up to a neurologist. - Continue Eliquis - Follow-up in clinic with neurology.  Acute kidney injury -Mildly elevated on presentation, improved with fluids.  On the day of discharge creatinine back to patient's baseline.  Proximal A-fib Held metoprolol on presentation. Restarted prior to discharge. -Continue Eliquis 5 mg  -Continue metoprolol 25 mg XL.   History of bladder cancer Stable.  Chronic medical problems  Panic attacks -Continue alprazolam   Hypertension Stable. Held lisinopril due to AKI.  -Restarted after AKI resolved.    Discharge  Subjective: Patient evaluated at bedside this AM.  He is reporting about a week of sleep changes prior to coming to the hospital.   Discharge Exam:   BP 111/71 (BP Location: Left Arm)   Pulse 63   Temp 97.7 F (36.5 C) (Axillary)   Resp 18   Ht 5\' 11"  (1.803 m)   Wt 105.2 kg   SpO2 99%   BMI 32.36 kg/m  Constitutional: Not in acute distress. Pulmonary/Chest: normal work of breathing on room air, lungs clear to auscultation bilaterally Abdominal: soft, non-tender, non-distended Neurological:  Mental: alert and oriented -Cranial nerve 2-12 normal. -5/5 strength in bilateral upper and lower extremities, normal gait  Pertinent Labs, Studies, and Procedures:     Latest Ref Rng & Units 04/18/2023   12:57 PM 04/18/2023   12:14 PM  CBC  WBC 4.0 - 10.5 K/uL  10.0   Hemoglobin 13.0 - 17.0 g/dL 16.1  09.6   Hematocrit 39.0 - 52.0 % 46.0  47.1   Platelets 150 - 400 K/uL  204        Latest Ref Rng & Units 04/19/2023    8:08 AM 04/18/2023   12:57 PM 04/18/2023   12:14 PM  CMP  Glucose 70 - 99 mg/dL 90  95  97   BUN 8 - 23 mg/dL 18  27  25    Creatinine 0.61 - 1.24 mg/dL 0.45  4.09  8.11   Sodium 135 - 145 mmol/L 133  135  133   Potassium 3.5 - 5.1 mmol/L 3.7  4.2  4.1   Chloride 98 - 111 mmol/L 102  102  101   CO2 22 - 32 mmol/L 21   19   Calcium 8.9 - 10.3 mg/dL 8.6   8.9   Total Protein 6.5 - 8.1 g/dL   6.3   Total Bilirubin 0.3 - 1.2 mg/dL   0.7   Alkaline Phos 38 - 126 U/L   78   AST 15 - 41 U/L   49   ALT 0 - 44 U/L   46     ECHOCARDIOGRAM COMPLETE BUBBLE STUDY  Result Date: 04/19/2023    ECHOCARDIOGRAM REPORT   Patient Name:   Martin Golden Date of Exam: 04/19/2023 Medical Rec #:  [id number]     Height:       71.0 in Accession #:    [id number]    Weight:       232.0 lb Date of Birth:  06/10/1951     BSA:          2.246 m Patient Age:    72 years      BP:           93/53 mmHg Patient Gender: M             HR:           73 bpm. Exam Location:  Inpatient Procedure: 2D Echo,  Cardiac Doppler, Color Doppler and  Saline Contrast Bubble            Study Indications:    I63.9 Stroke  History:        Patient has no prior history of Echocardiogram examinations.                 TIA; Risk Factors:Former Smoker.  Sonographer:    Dondra Prader RVT RCS Referring Phys: SAAD NOORUDDIN  Sonographer Comments: Image acquisition challenging due to uncooperative patient and inappropriate behavior towards Sonographer.  MD Note: Future studies may be best served with a male sonographer.  IMPRESSIONS  1. Left ventricular ejection fraction, by estimation, is 65 to 70%. The left ventricle has normal function. Left ventricular endocardial border not optimally defined to evaluate regional wall motion. Left ventricular diastolic parameters were normal.  2. Right ventricular systolic function is normal. The right ventricular size is normal.  3. The mitral valve is grossly normal. No evidence of mitral valve regurgitation.  4. The aortic valve is tricuspid. Aortic valve regurgitation is not visualized. Aortic valve sclerosis is present, with no evidence of aortic valve stenosis. Comparison(s): No prior Echocardiogram. FINDINGS  Left Ventricle: Left ventricular ejection fraction, by estimation, is 65 to 70%. The left ventricle has normal function. Left ventricular endocardial border not optimally defined to evaluate regional wall motion. The left ventricular internal cavity size was normal in size. There is no left ventricular hypertrophy. Left ventricular diastolic parameters were normal. Right Ventricle: The right ventricular size is normal. No increase in right ventricular wall thickness. Right ventricular systolic function is normal. Left Atrium: Left atrial size was normal in size. Right Atrium: Right atrial size was normal in size. Pericardium: There is no evidence of pericardial effusion. Presence of epicardial fat layer. Mitral Valve: The mitral valve is grossly normal. No evidence of mitral valve  regurgitation. Tricuspid Valve: The tricuspid valve is grossly normal. Tricuspid valve regurgitation is not demonstrated. Aortic Valve: The aortic valve is tricuspid. Aortic valve regurgitation is not visualized. Aortic valve sclerosis is present, with no evidence of aortic valve stenosis. Aortic valve mean gradient measures 5.0 mmHg. Aortic valve peak gradient measures 8.9  mmHg. Aortic valve area, by VTI measures 3.27 cm. Pulmonic Valve: The pulmonic valve was not well visualized. Pulmonic valve regurgitation is not visualized. Aorta: The aortic root and ascending aorta are structurally normal, with no evidence of dilitation. IAS/Shunts: No atrial level shunt detected by color flow Doppler. Agitated saline contrast was given intravenously to evaluate for intracardiac shunting.  LEFT VENTRICLE PLAX 2D LVIDd:         4.30 cm   Diastology LVIDs:         2.30 cm   LV e' medial:    8.70 cm/s LV PW:         1.00 cm   LV E/e' medial:  8.8 LV IVS:        1.00 cm   LV e' lateral:   12.60 cm/s LVOT diam:     2.10 cm   LV E/e' lateral: 6.1 LV SV:         95 LV SV Index:   42 LVOT Area:     3.46 cm  RIGHT VENTRICLE RV S prime:     13.80 cm/s TAPSE (M-mode): 3.4 cm LEFT ATRIUM             Index        RIGHT ATRIUM           Index LA diam:  3.50 cm 1.56 cm/m   RA Area:     12.00 cm LA Vol (A2C):   42.2 ml 18.79 ml/m  RA Volume:   23.30 ml  10.37 ml/m LA Vol (A4C):   23.4 ml 10.42 ml/m LA Biplane Vol: 34.7 ml 15.45 ml/m  AORTIC VALVE                     PULMONIC VALVE AV Area (Vmax):    3.20 cm      PV Vmax:       1.32 m/s AV Area (Vmean):   3.19 cm      PV Peak grad:  7.0 mmHg AV Area (VTI):     3.27 cm AV Vmax:           149.00 cm/s AV Vmean:          102.300 cm/s AV VTI:            0.292 m AV Peak Grad:      8.9 mmHg AV Mean Grad:      5.0 mmHg LVOT Vmax:         137.50 cm/s LVOT Vmean:        94.100 cm/s LVOT VTI:          0.276 m LVOT/AV VTI ratio: 0.94  AORTA Ao Root diam: 3.20 cm Ao Asc diam:  3.40 cm  MITRAL VALVE MV Area (PHT): 3.17 cm    SHUNTS MV Decel Time: 239 msec    Systemic VTI:  0.28 m MV E velocity: 76.50 cm/s  Systemic Diam: 2.10 cm MV A velocity: 68.70 cm/s MV E/A ratio:  1.11 Riley Lam MD Electronically signed by Riley Lam MD Signature Date/Time: 04/19/2023/5:44:14 PM    Final    EEG adult  Result Date: 04/19/2023 Charlsie Quest, MD     04/19/2023  2:32 PM Patient Name: Rylynn Henneberger MRN: [id number] Epilepsy Attending: Charlsie Quest Referring Physician/Provider: Olegario Messier, MD Date: 04/19/2023 Duration: 19 mins Patient history: 72 yo M ith transient difficulty following commands with repetitive speech. EEG to evaluate for seizure Level of alertness: Awake AEDs during EEG study: GBP Technical aspects: This EEG study was done with scalp electrodes positioned according to the 10-20 International system of electrode placement. Electrical activity was reviewed with band pass filter of 1-70Hz , sensitivity of 7 uV/mm, display speed of 54mm/sec with a 60Hz  notched filter applied as appropriate. EEG data were recorded continuously and digitally stored.  Video monitoring was available and reviewed as appropriate. Description: The posterior dominant rhythm consists of 8 Hz activity of moderate voltage (25-35 uV) seen predominantly in posterior head regions, symmetric and reactive to eye opening and eye closing. Physiologic photic driving was not seen during photic stimulation.  Hyperventilation was not performed.    Of note, study had to be stopped prematurely due to patient tolerability.   IMPRESSION: This study is within normal limits. No seizures or epileptiform discharges were seen throughout the recording. A normal interictal EEG does not exclud the diagnosis of epilepsy. Charlsie Quest   MR BRAIN WO CONTRAST  Result Date: 04/18/2023 CLINICAL DATA:  Transient ischemic attack. EXAM: MRI HEAD WITHOUT CONTRAST TECHNIQUE: Multiplanar, multiecho pulse sequences of the  brain and surrounding structures were obtained without intravenous contrast. COMPARISON:  MRI brain 06/02/2022.  CTA head/neck 04/18/2023. FINDINGS: Brain: Moderately motion degraded study. Within this limitation, no acute infarct or hemorrhage. Stable mild chronic small-vessel disease. No mass or midline shift. No hydrocephalus or extra-axial  collection. No abnormal susceptibility. Vascular: Normal flow voids. Skull and upper cervical spine: Normal marrow signal. Sinuses/Orbits: No acute findings. Other: None. IMPRESSION: Moderately motion degraded study. Within this limitation, no acute intracranial process. Electronically Signed   By: Orvan Falconer M.D.   On: 04/18/2023 21:40   CT ANGIO HEAD NECK W WO CM  Result Date: 04/18/2023 CLINICAL DATA:  Neuro deficit, acute, stroke suspected. EXAM: CT ANGIOGRAPHY HEAD AND NECK WITH AND WITHOUT CONTRAST TECHNIQUE: Multidetector CT imaging of the head and neck was performed using the standard protocol during bolus administration of intravenous contrast. Multiplanar CT image reconstructions and MIPs were obtained to evaluate the vascular anatomy. Carotid stenosis measurements (when applicable) are obtained utilizing NASCET criteria, using the distal internal carotid diameter as the denominator. RADIATION DOSE REDUCTION: This exam was performed according to the departmental dose-optimization program which includes automated exposure control, adjustment of the mA and/or kV according to patient size and/or use of iterative reconstruction technique. CONTRAST:  75mL OMNIPAQUE IOHEXOL 350 MG/ML SOLN COMPARISON:  Brain MRI 06/02/2022 FINDINGS: CT HEAD FINDINGS Brain: No visible acute infarction. The brainstem and cerebellum are normal. There are minimal small vessel changes of the hemispheric white matter and old small cortical/subcortical insult at the left frontal vertex. No mass, hemorrhage, hydrocephalus or extra-axial collection. Vascular: No abnormal vascular finding.  Skull: Normal Sinuses/Orbits: Clear/normal Other: None Review of the MIP images confirms the above findings CTA NECK FINDINGS Aortic arch: Normal aortic arch.  Normal branching pattern. Right carotid system: Common carotid artery widely patent to the bifurcation. Minimal soft plaque at the bifurcation but no stenosis. Cervical ICA is normal. Left carotid system: Common carotid artery widely patent to the bifurcation. Minimal soft plaque at the bifurcation but no stenosis. Cervical ICA is normal. Vertebral arteries: Both vertebral artery origins are widely patent. Both vertebral arteries appear normal through the cervical region to the foramen magnum. Skeleton: Ordinary spondylosis and facet arthropathy. Other neck: No mass or lymphadenopathy. Upper chest: Lung apices are clear. Review of the MIP images confirms the above findings CTA HEAD FINDINGS Anterior circulation: Both internal carotid arteries are patent through the skull base and siphon regions. No siphon stenosis. The anterior and middle cerebral vessels are patent. No large vessel occlusion or proximal stenosis. No aneurysm or vascular malformation. Posterior circulation: Both vertebral arteries widely patent through the foramen magnum to the basilar artery. No basilar stenosis. Posterior circulation branch vessels are normal. Venous sinuses: Patent and normal. Anatomic variants: None significant. Review of the MIP images confirms the above findings IMPRESSION: 1. No acute CT finding. Minimal small vessel changes of the hemispheric white matter and old small cortical/subcortical insult at the left frontal vertex. 2. Minimal soft plaque at both carotid bifurcations but no stenosis. 3. Normal intracranial CTA. Electronically Signed   By: Paulina Fusi M.D.   On: 04/18/2023 13:48   DG Chest Portable 1 View  Result Date: 04/18/2023 CLINICAL DATA:  Altered mental status EXAM: PORTABLE CHEST 1 VIEW COMPARISON:  06/03/2022 FINDINGS: Bibasilar atelectasis versus  scarring. Background COPD/emphysema suspected. Stable heart size and vascularity. Negative for edema, definite pneumonia, large effusion or pneumothorax. Trachea midline. Degenerative changes of the spine. IMPRESSION: 1. Bibasilar atelectasis versus scarring. 2. Suspect COPD/emphysema. Electronically Signed   By: Judie Petit.  Shick M.D.   On: 04/18/2023 12:34     Discharge Instructions:  Signed:  Laretta Bolster, M.D.  Internal Medicine Resident, PGY-1 [facility]  Pager: [phone] 9:55 AM, 04/20/2023   Please contact the  on call pager after 5 pm and on weekends at [phone].

## 2023-04-20 NOTE — TOC Transition Note (Signed)
Transition of Care [facility]) - CM/SW Discharge Note   Patient Details  Name: Martin Golden MRN: [id number] Date of Birth: [birth year]  [facility] CM/SW Contact:  Kermit Balo, RN Phone Number: 04/20/2023, 1:30 PM   Clinical Narrative:    Pt is doing better today and is discharging home with self care.  Son to provide transportation home.    Final next level of care: Home/Self Care Barriers to Discharge: No Barriers Identified   Patient Goals and CMS Choice CMS Medicare.gov Compare Post Acute Care list provided to:: Patient Represenative (must comment) Choice offered to / list presented to : Adult Children  Discharge Placement                         Discharge Plan and Services Additional resources added to the After Visit Summary for                                       Social Determinants of Health (SDOH) Interventions SDOH Screenings   Food Insecurity: No Food Insecurity (04/18/2023)  Housing: Low Risk  (04/18/2023)  Transportation Needs: No Transportation Needs (04/18/2023)  Utilities: Not At Risk (04/18/2023)  Tobacco Use: Medium Risk (04/18/2023)     Readmission Risk Interventions     No data to display

## 2023-04-20 NOTE — Discharge Instructions (Addendum)
It was a pleasure taking care of you!  You were admitted due to having strokelike symptoms, all of which has resolved.   -Continue 5 mg of rosuvastatin. -Continue Eliquis 5 mg. -Follow-up with your PCP Marcelino Duster, MD -Follow-up in neurology clinic  Please report to the hospital if you experience arm or leg weakness, facial drooping or trouble speaking.

## 2023-04-20 NOTE — Progress Notes (Signed)
Occupational Therapy Treatment Patient Details Name: Martin Golden MRN: [id number] DOB: 17-Jun-1951 Today's Date: 04/20/2023   History of present illness Pt is a 72 y/o male presenting with impaired speech when talking on phone with [employer] staff. MRI brain negative, suspected TIA and AKI. PMH: TIAs, prostate cancer, a fib, HTN   OT comments  Focused session on further functional cognitive assessments w/ pill box test. Pt able to complete entire pill box test and self correct any errors without cues - [facility] for med mgmt at home. Pt is continuing to endorse R shoulder pain from a fall in June - may benefit from further follow up in OP setting to address R UE function.      If plan is discharge home, recommend the following:      Equipment Recommendations  None recommended by OT    Recommendations for Other Services      Precautions / Restrictions Precautions Precautions: Fall Restrictions Weight Bearing Restrictions: No       Mobility Bed Mobility Overal bed mobility: Modified Independent Bed Mobility: Supine to Sit, Sit to Supine                Transfers                         Balance Overall balance assessment: Needs assistance Sitting-balance support: Feet supported Sitting balance-Leahy Scale: Normal                                     ADL either performed or assessed with clinical judgement   ADL Overall ADL's : Modified independent                                            Extremity/Trunk Assessment Upper Extremity Assessment Upper Extremity Assessment: Overall WFL for tasks assessed;Right hand dominant;RUE deficits/detail RUE Deficits / Details: R shoulder pain since fall in June, able to flex to 110* but painful. provided ice, educated on gentle stretches, towel pushes. encouraged further follow up imaging   Lower Extremity Assessment Lower Extremity Assessment: Defer to PT evaluation        Vision    Vision Assessment?: No apparent visual deficits   Perception     Praxis      Cognition Arousal: Alert Behavior During Therapy: Restless Overall Cognitive Status: No family/caregiver present to determine baseline cognitive functioning                                 General Comments: much improved speech, able to read menu, indicate what he wanted for breakfast. appropriate in conversation and reports he needs to pay the power bill today. Administered pill box test w/ pt able to complete test, self correct any mistakes quickly and without cues. [facility]        Exercises      Shoulder Instructions       General Comments      Pertinent Vitals/ Pain       Pain Assessment Pain Assessment: Faces Faces Pain Scale: Hurts little more Pain Location: R shoulder Pain Descriptors / Indicators: Grimacing, Discomfort, Sore Pain Intervention(s): Monitored during session  Home Living  Prior Functioning/Environment              Frequency  Min 1X/week        Progress Toward Goals  OT Goals(current goals can now be found in the care plan section)  Progress towards OT goals: Progressing toward goals  Acute Rehab OT Goals Patient Stated Goal: home today OT Goal Formulation: With patient Time For Goal Achievement: 05/03/23 Potential to Achieve Goals: Good ADL Goals Pt Will Perform Grooming: Independently;standing Pt Will Transfer to Toilet: Independently;ambulating Additional ADL Goal #1: Pt to complete pill box test with 0 errors Additional ADL Goal #2: Pt to complete 3 step trail making task with no more than min verbal cues Additional ADL Goal #3: Pt to name 5/5 ADL items correctly without cues  Plan      Co-evaluation                 AM-PAC OT "6 Clicks" Daily Activity     Outcome Measure   Help from another person eating meals?: None Help from another person taking care of personal  grooming?: None Help from another person toileting, which includes using toliet, bedpan, or urinal?: None Help from another person bathing (including washing, rinsing, drying)?: A Little Help from another person to put on and taking off regular upper body clothing?: A Little Help from another person to put on and taking off regular lower body clothing?: A Little 6 Click Score: 21    End of Session    OT Visit Diagnosis: Unsteadiness on feet (R26.81);Cognitive communication deficit (R41.841);Other symptoms and signs involving cognitive function   Activity Tolerance Patient tolerated treatment well   Patient Left in bed;with call bell/phone within reach;Other (comment) (declined bed alarm - RN aware)   Nurse Communication Mobility status        Time: [phone] OT Time Calculation (min): 26 min  Charges: OT General Charges $OT Visit: 1 Visit OT Treatments $Self Care/Home Management : 23-37 mins  Bradd Canary, OTR/L Acute Rehab Services Office: [phone]   Lorre Munroe 04/20/2023, 8:03 AM

## 2023-04-20 NOTE — Progress Notes (Signed)
Physical Therapy Treatment Patient Details Name: Martin Golden MRN: [id number] DOB: 1951/08/04 Today's Date: 04/20/2023   History of Present Illness Pt is a 72 y/o male presenting 04/18/23 with impaired speech when talking on phone with [employer] staff. MRI brain negative, suspected TIA and AKI. PMH: TIAs, prostate cancer, a fib, HTN    PT Comments  Pt not recalling whether he ordered breakfast yet or not this morning, but otherwise cognition appears to have improved. He was also agreeable to ambulating further distances today, demonstrating a trendelenburg gait pattern (hx of R hip issues/replacement per pt) with a tendency to sway/drift laterally to the L. He is at risk for falls, but his stability improved greatly when he utilized a [facility], which he reports he uses at baseline. He denies any STE his home, thus did not practice stairs today. Encouraged pt to reach out to MD for PT referral if balance does not continue to improve back to his baseline or if his R shoulder continues to be painful. He verbalized understanding. Will continue to follow acutely.     If plan is discharge home, recommend the following: Supervision due to cognitive status   Can travel by private vehicle        Equipment Recommendations  None recommended by PT    Recommendations for Other Services       Precautions / Restrictions Precautions Precautions: Fall Restrictions Weight Bearing Restrictions: No     Mobility  Bed Mobility Overal bed mobility: Modified Independent Bed Mobility: Supine to Sit     Supine to sit: Modified independent (Device/Increase time), HOB elevated     General bed mobility comments: No assistance needed    Transfers Overall transfer level: Needs assistance Equipment used: None Transfers: Sit to/from Stand Sit to Stand: Supervision           General transfer comment: Supervision for safety    Ambulation/Gait Ambulation/Gait assistance: Contact guard assist Gait  Distance (Feet): 330 Feet Assistive device: None, Straight cane Gait Pattern/deviations: Step-through pattern, Decreased stride length, Drifts right/left, Trendelenburg Gait velocity: WFL Gait velocity interpretation: >2.62 ft/sec, indicative of community ambulatory   General Gait Details: Pt ambulated in the halls with a step-through gait pattern with trendelenburg gait pattern (R hip dropping) as well. He tends to drift/sway to the L, more so when not utilizing an AD but improved stability with SPC, contact guard assist for safety   Stairs             Wheelchair Mobility     Tilt Bed    Modified Rankin (Stroke Patients Only)       Balance Overall balance assessment: Mild deficits observed, not formally tested                                          Cognition Arousal: Alert Behavior During Therapy: Restless Overall Cognitive Status: No family/caregiver present to determine baseline cognitive functioning                                 General Comments: Pt not recalling whether he ordered breakfast or not but otherwise seemed appropriate        Exercises      General Comments General comments (skin integrity, edema, etc.): Educated pt on standing hip abduction exercises with counter support to improve hip strength/stability  and to follow up with MD in regards to R shoulder pain from prior fall; encouraged pt to ask for PT referral from MD if balance does not improve and if shoulder continues to hurt; he verbalized understanding of all education      Pertinent Vitals/Pain Pain Assessment Pain Assessment: Faces Faces Pain Scale: Hurts little more Pain Location: R shoulder Pain Descriptors / Indicators: Grimacing, Discomfort, Sore Pain Intervention(s): Limited activity within patient's tolerance, Monitored during session    Home Living                          Prior Function            PT Goals (current goals can  now be found in the care plan section) Acute Rehab PT Goals Patient Stated Goal: to go home PT Goal Formulation: With patient Time For Goal Achievement: 04/26/23 Progress towards PT goals: Progressing toward goals    Frequency    Min 1X/week      PT Plan      Co-evaluation              AM-PAC PT "6 Clicks" Mobility   Outcome Measure  Help needed turning from your back to your side while in a flat bed without using bedrails?: None Help needed moving from lying on your back to sitting on the side of a flat bed without using bedrails?: None Help needed moving to and from a bed to a chair (including a wheelchair)?: A Little Help needed standing up from a chair using your arms (e.g., wheelchair or bedside chair)?: A Little Help needed to walk in hospital room?: A Little Help needed climbing 3-5 steps with a railing? : A Little 6 Click Score: 20    End of Session   Activity Tolerance: Patient tolerated treatment well Patient left: in bed;with call bell/phone within reach (OT reports RN was ok with no bed alarm for pt, pt upset when trying to turn bed alarm on so kept it off)   PT Visit Diagnosis: Unsteadiness on feet (R26.81);Other abnormalities of gait and mobility (R26.89);Difficulty in walking, not elsewhere classified (R26.2);Other symptoms and signs involving the nervous system (R29.898)     Time: 8413-2440 PT Time Calculation (min) (ACUTE ONLY): 12 min  Charges:    $Therapeutic Activity: 8-22 mins PT General Charges $$ ACUTE PT VISIT: 1 Visit                     Raymond Gurney, PT, DPT Acute Rehabilitation Services  Office: [phone]    Jewel Baize 04/20/2023, 9:37 AM

## 2023-07-08 DEATH — deceased
